# Patient Record
Sex: Male | Born: 1955
Health system: Southern US, Community
[De-identification: ages and names within clinical notes are randomized; demographics above are authoritative.]

## PROBLEM LIST (undated history)

## (undated) DIAGNOSIS — I471 Supraventricular tachycardia, unspecified: Secondary | ICD-10-CM

## (undated) DIAGNOSIS — N200 Calculus of kidney: Secondary | ICD-10-CM

## (undated) DIAGNOSIS — F419 Anxiety disorder, unspecified: Secondary | ICD-10-CM

## (undated) DIAGNOSIS — R7303 Prediabetes: Secondary | ICD-10-CM

## (undated) DIAGNOSIS — E559 Vitamin D deficiency, unspecified: Secondary | ICD-10-CM

## (undated) DIAGNOSIS — H9319 Tinnitus, unspecified ear: Secondary | ICD-10-CM

## (undated) DIAGNOSIS — K8020 Calculus of gallbladder without cholecystitis without obstruction: Secondary | ICD-10-CM

## (undated) DIAGNOSIS — M5430 Sciatica, unspecified side: Secondary | ICD-10-CM

## (undated) DIAGNOSIS — I10 Essential (primary) hypertension: Secondary | ICD-10-CM

## (undated) DIAGNOSIS — R739 Hyperglycemia, unspecified: Secondary | ICD-10-CM

## (undated) DIAGNOSIS — E7800 Pure hypercholesterolemia, unspecified: Secondary | ICD-10-CM

## (undated) DIAGNOSIS — J302 Other seasonal allergic rhinitis: Secondary | ICD-10-CM

## (undated) DIAGNOSIS — Z6830 Body mass index (BMI) 30.0-30.9, adult: Secondary | ICD-10-CM

## (undated) DIAGNOSIS — N400 Enlarged prostate without lower urinary tract symptoms: Secondary | ICD-10-CM

## (undated) DIAGNOSIS — K649 Unspecified hemorrhoids: Secondary | ICD-10-CM

## (undated) DIAGNOSIS — Z87442 Personal history of urinary calculi: Secondary | ICD-10-CM

## (undated) DIAGNOSIS — M549 Dorsalgia, unspecified: Secondary | ICD-10-CM

## (undated) DIAGNOSIS — G4730 Sleep apnea, unspecified: Secondary | ICD-10-CM

## (undated) DIAGNOSIS — E781 Pure hyperglyceridemia: Secondary | ICD-10-CM

## (undated) HISTORY — DX: Tinnitus, unspecified ear: H93.19

## (undated) HISTORY — DX: Supraventricular tachycardia: I47.1

## (undated) HISTORY — DX: Pure hypercholesterolemia, unspecified: E78.00

## (undated) HISTORY — DX: Supraventricular tachycardia, unspecified: I47.10

## (undated) HISTORY — DX: Sleep apnea, unspecified: G47.30

## (undated) HISTORY — DX: Anxiety disorder, unspecified: F41.9

## (undated) HISTORY — DX: Other seasonal allergic rhinitis: J30.2

## (undated) HISTORY — DX: Benign prostatic hyperplasia without lower urinary tract symptoms: N40.0

## (undated) HISTORY — DX: Prediabetes: R73.03

## (undated) HISTORY — DX: Hyperglycemia, unspecified: R73.9

## (undated) HISTORY — DX: Vitamin D deficiency, unspecified: E55.9

## (undated) HISTORY — DX: Essential (primary) hypertension: I10

## (undated) HISTORY — DX: Dorsalgia, unspecified: M54.9

## (undated) HISTORY — DX: Sciatica, unspecified side: M54.30

## (undated) HISTORY — DX: Body mass index (BMI) 30.0-30.9, adult: Z68.30

## (undated) HISTORY — DX: Calculus of gallbladder without cholecystitis without obstruction: K80.20

## (undated) HISTORY — DX: Unspecified hemorrhoids: K64.9

## (undated) HISTORY — DX: Pure hyperglyceridemia: E78.1

## (undated) HISTORY — PX: DE QUERVAIN'S RELEASE: SHX1439

## (undated) HISTORY — DX: Calculus of kidney: N20.0

---

## 2014-01-15 HISTORY — PX: HEMORRHOID BANDING: SHX5850

## 2014-10-29 ENCOUNTER — Other Ambulatory Visit: Payer: Self-pay | Admitting: *Deleted

## 2014-10-29 ENCOUNTER — Ambulatory Visit (HOSPITAL_COMMUNITY)
Admission: RE | Admit: 2014-10-29 | Discharge: 2014-10-29 | Disposition: A | Discharge status: Home / Self Care | Payer: BLUE CROSS/BLUE SHIELD | Source: Ambulatory Visit | Attending: Vascular Surgery | Admitting: Vascular Surgery

## 2014-10-29 DIAGNOSIS — M79606 Pain in leg, unspecified: Secondary | ICD-10-CM | POA: Diagnosis not present

## 2014-10-29 DIAGNOSIS — I8391 Asymptomatic varicose veins of right lower extremity: Secondary | ICD-10-CM | POA: Diagnosis not present

## 2014-10-29 DIAGNOSIS — R600 Localized edema: Secondary | ICD-10-CM | POA: Diagnosis not present

## 2014-10-29 DIAGNOSIS — M79661 Pain in right lower leg: Secondary | ICD-10-CM | POA: Insufficient documentation

## 2015-03-10 MED FILL — ESCITALOPRAM 10 MG TABLET: 10 | 90 days supply | Qty: 90 | Fill #3

## 2015-03-10 MED FILL — PIOGLITAZONE HCL 30 MG TAB: 30 | 90 days supply | Qty: 90 | Fill #1

## 2015-03-10 MED FILL — FENOFIBRATE 145 MG TABLET: 145 | 90 days supply | Qty: 90 | Fill #0

## 2015-06-01 MED FILL — PIOGLITAZONE HCL 30 MG TAB: 30 | 90 days supply | Qty: 90 | Fill #0

## 2015-06-01 MED FILL — FENOFIBRATE 145 MG TABLET: 145 | 90 days supply | Qty: 90 | Fill #0

## 2015-06-01 MED FILL — ESCITALOPRAM 10 MG TABLET: 10 | 90 days supply | Qty: 90 | Fill #0

## 2015-09-12 MED FILL — PIOGLITAZONE HCL 30 MG TAB: 30 | 90 days supply | Qty: 90 | Fill #1

## 2015-09-12 MED FILL — FENOFIBRATE 145 MG TABLET: 145 | 90 days supply | Qty: 90 | Fill #1

## 2015-09-12 MED FILL — ESCITALOPRAM 10 MG TABLET: 10 | 90 days supply | Qty: 90 | Fill #1

## 2015-12-16 MED FILL — ESCITALOPRAM 10 MG TABLET: 10 | 90 days supply | Qty: 90 | Fill #2 | Status: TO

## 2015-12-16 MED FILL — FENOFIBRATE 145 MG TABLET: 145 | 90 days supply | Qty: 90 | Fill #2 | Status: TO

## 2015-12-16 MED FILL — PIOGLITAZONE HCL 30 MG TAB: 30 | 90 days supply | Qty: 90 | Fill #2 | Status: TO

## 2016-04-11 ENCOUNTER — Encounter: Payer: Self-pay | Admitting: Sports Medicine

## 2016-04-11 ENCOUNTER — Ambulatory Visit (INDEPENDENT_AMBULATORY_CARE_PROVIDER_SITE_OTHER): Payer: PRIVATE HEALTH INSURANCE | Admitting: Sports Medicine

## 2016-04-11 ENCOUNTER — Ambulatory Visit: Payer: Self-pay

## 2016-04-11 VITALS — BP 140/72 | HR 72 | Ht 74.0 in | Wt 253.2 lb

## 2016-04-11 DIAGNOSIS — M25531 Pain in right wrist: Secondary | ICD-10-CM | POA: Diagnosis not present

## 2016-04-11 DIAGNOSIS — M654 Radial styloid tenosynovitis [de Quervain]: Secondary | ICD-10-CM | POA: Insufficient documentation

## 2016-04-11 NOTE — Assessment & Plan Note (Signed)
Ultrasound-guided injection today.  He responded well to the last.  If persistent ongoing symptoms consider further evaluation and consideration of first dorsal compartment release.

## 2016-04-11 NOTE — Progress Notes (Signed)
Henry Fleming - 61 y.o. male MRN [id number]  Date of birth: 04-Sep-1955  Office Visit Note: Visit Date: 04/11/2016 PCP: Maryelizabeth Rowan, MD Referred by: Lewis Moccasin, MD  Subjective: Chief Complaint  Patient presents with  . pain in wrist    right   HPI: Patient presents for reevaluation of right dorsal wrist pain.  This is worsened over the past 2 months.  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  Reports he thinks this has caused him to flare back up.  He did well with a prior de Quervain's injection with 100% improvement in his symptoms.  He is now having difficulty fully extending his thumb. ROS:   Otherwise per HPI.  Objective:  VS:  HT:6\' 2"  (188 cm)   WT:253 lb 3.2 oz (114.9 kg)  BMI:32.6    BP:140/72  HR:72bpm  TEMP: ( )  RESP:96 % Physical Exam: General:   WDWN, NAD, Non-toxic appearing Psych:  Alert & appropriately interactive  Not depressed or anxious appearing Upper Extremities:  No significant rashes  No significant generalized UE edema.  No clubbing or cyanosis.  Radial pulses 2+/4.  Sensation intact to light touch. Right Wrist/Hand:  Overall wrist  is well aligned, no significant deformity or atrophy.    No significant effusion or swelling.    Grip strength intact.  Wrist extension is normal.  He does have limitation in thumb extension by approximately 10 on the right compared to the left.  No significant TTP over the anatomic snuff box, distal radius & ulna, or proximal & distal carpal rows  Pain with Lourena Simmonds testing    Imaging & Procedures: No results found. PROCEDURE NOTE: ULTRASOUND GUIDED RIGHT 1st DORSAL COMPARTMENTINJECTION  Images were obtained and interpreted by myself, Gaspar Bidding, DO  Images have been saved and stored to PACS system. Images obtained on: GE S7 Ultrasound machine  DESCRIPTION OF PROCEDURE:  The patient's clinical condition is marked by substantial pain  and/or significant functional disability. Other conservative therapy has not provided relief, is contraindicated, or not appropriate. There is a reasonable likelihood that injection will significantly improve the patient's pain and/or functional impairment. After discussing the risks, benefits and expected outcomes of the injection and all questions were reviewed and answered, the patient wished to undergo the above named procedure. Verbal consent was obtained. The ultrasound was used to identify the target structure and adjacent neurovascular structures. The skin was then prepped in sterile fashion and the target structure was injected under direct visualization using sterile technique as below: PREP: Alcohol, Ethel Chloride APPROACH: 25g 1.5" needle INJECTATE: 0.5cc 1% lidocaine, 0.5cc 0.5% marcaine, 0.5cc 40mg  DepoMedrol ASPIRATE: N/A DRESSING: Band-Aid   Post procedural instructions including recommending icing and warning signs for infection were reviewed. This procedure was well tolerated and there were no complications.   IMPRESSION: Succesful US Guided Injection    Assessment & Plan: Problem List Items Addressed This Visit    De Quervain's tenosynovitis - Primary    Ultrasound-guided injection today.  He responded well to the last.  If persistent ongoing symptoms consider further evaluation and consideration of first dorsal compartment release.      Relevant Orders   US Guided Needle Placement    Other Visit Diagnoses    Right wrist pain          Follow-up: Return if symptoms worsen or fail to improve.   Past Medical/Family/Surgical/Social History: Medications & Allergies reviewed per EMR Patient Active Problem List  Diagnosis Date Noted  . De Quervain's tenosynovitis 04/11/2016   No past medical history on file. No family history on file. No past surgical history on file. Social History   Occupational History  . Not on file.   Social History Main Topics  .  Smoking status: Never Smoker  . Smokeless tobacco: Never Used  . Alcohol use Not on file  . Drug use: Unknown  . Sexual activity: Not on file

## 2018-10-24 ENCOUNTER — Other Ambulatory Visit: Payer: Self-pay

## 2018-10-24 DIAGNOSIS — Z20822 Contact with and (suspected) exposure to covid-19: Secondary | ICD-10-CM

## 2018-10-25 LAB — NOVEL CORONAVIRUS, NAA: SARS-CoV-2, NAA: NOT DETECTED

## 2020-01-25 ENCOUNTER — Other Ambulatory Visit: Payer: Self-pay

## 2020-01-25 ENCOUNTER — Encounter: Payer: Self-pay | Admitting: Internal Medicine

## 2020-01-25 ENCOUNTER — Ambulatory Visit (INDEPENDENT_AMBULATORY_CARE_PROVIDER_SITE_OTHER): Payer: Medicare Other | Admitting: Internal Medicine

## 2020-01-25 VITALS — BP 120/70 | HR 66 | Ht 75.0 in | Wt 250.0 lb

## 2020-01-25 DIAGNOSIS — I471 Supraventricular tachycardia: Secondary | ICD-10-CM

## 2020-01-25 DIAGNOSIS — E781 Pure hyperglyceridemia: Secondary | ICD-10-CM | POA: Diagnosis not present

## 2020-01-25 DIAGNOSIS — R0683 Snoring: Secondary | ICD-10-CM

## 2020-01-25 MED ORDER — DILTIAZEM HCL 30 MG PO TABS: 30.0000 mg | ORAL_TABLET | ORAL | 3 refills | Status: AC | PRN

## 2020-01-25 NOTE — Patient Instructions (Addendum)
Medication Instructions:  Start Diltiazem 30 mg to 60 mg every 6 hours as needed for fast heart rate. Labwork: None ordered.  Testing/Procedures: please schedule echo Your physician has recommended that you have a sleep study. This test records several body functions during sleep, including: brain activity, eye movement, oxygen and carbon dioxide blood levels, heart rate and rhythm, breathing rate and rhythm, the flow of air through your mouth and nose, snoring, body muscle movements, and chest and belly movement.  Your physician has requested that you have an echocardiogram. Echocardiography is a painless test that uses sound waves to create images of your heart. It provides your doctor with information about the size and shape of your heart and how well your heart's chambers and valves are working. This procedure takes approximately one hour. There are no restrictions for this procedure.   Follow-Up: Your physician wants you to follow-up in: as needed with Hillis Range, MD or one of the following Advanced Practice Providers on your designated Care Team:    Gypsy Balsam, NP  Francis Dowse, PA-C  Casimiro Needle "Mardelle Matte" Lanna Poche, [place]   Any Other Special Instructions Will Be Listed Below (If Applicable).  If you need a refill on your cardiac medications before your next appointment, please call your pharmacy.

## 2020-01-25 NOTE — Progress Notes (Signed)
Electrophysiology Office Note   Date:  01/25/2020   ID:  Henry Fleming, DOB 1956-01-03, MRN [id number]  PCP:  Lewis Moccasin, MD  Primary Electrophysiologist: Hillis Range, MD    CC: SVT   History of Present Illness: Henry Fleming is a 65 y.o. male who presents today for electrophysiology evaluation.   He is referred by Dr Duanne Guess for EP consultation regarding SVT. The patient reports abrupt onset, offset tachypalpitations 1-2 times per year.  Symptoms are primarily palpitations.  If he exerts during tachycardia, he has SOB and dizziness.  He is unaware of triggers/ precipitants.  He has used Kardiamobile to document regular SVT at 150-180 bpm.  Today, he denies symptoms of palpitations, chest pain, shortness of breath, orthopnea, PND, lower extremity edema, claudication, dizziness, presyncope, syncope, bleeding, or neurologic sequela. The patient is tolerating medications without difficulties and is otherwise without complaint today.    Past Medical History:  Diagnosis Date  . Anxiety   . Body mass index (BMI) 30.0-30.9, adult   . BPH (benign prostatic hyperplasia)   . Gallstone    asymptomatic  . Hemorrhoid    s/p banding  . HTN (hypertension)   . Hypertriglyceridemia   . Nephrolithiasis   . Paroxysmal SVT (supraventricular tachycardia) (HCC)   . Prediabetes    impaired fasting glucose  . Sciatica    resolved  . Seasonal allergies   . Tinnitus   . Vitamin D deficiency    Past Surgical History:  Procedure Laterality Date  . DE QUERVAIN'S RELEASE       Current Outpatient Medications  Medication Sig Dispense Refill  . Alpha-Lipoic Acid 600 MG TABS Take 1 tablet by mouth daily.    [name] aspirin 81 MG chewable tablet Chew 81 mg by mouth daily.    . candesartan-hydrochlorothiazide (ATACAND HCT) 32-12.5 MG tablet Take 1 tablet by mouth daily.    . Cholecalciferol 1.25 MG (50000 UT) capsule Take 1 capsule by mouth once a week.    . Coenzyme Q10 100 MG capsule  Take 1 capsule by mouth daily.    [name] diltiazem (CARDIZEM) 30 MG tablet Take 1 tablet (30 mg total) by mouth as needed (1-2 tablets every 6 hours for fast heart rate.). 60 tablet 3  . escitalopram (LEXAPRO) 10 MG tablet Take 1 tablet by mouth daily.    [name] icosapent Ethyl (VASCEPA) 1 g capsule Take 2 capsules by mouth in the morning and at bedtime.    . Lutein-Zeaxanthin 20-1 MG CAPS Take 1 capsule by mouth daily.    . Multiple Vitamin (MULTIVITAMIN ADULT) TABS Take 1 tablet by mouth daily.    . pioglitazone (ACTOS) 15 MG tablet Take 15 mg by mouth daily.    . rosuvastatin (CRESTOR) 20 MG tablet Take 1 tablet by mouth daily.     No current facility-administered medications for this visit.    Allergies:   Ace inhibitors and Ramipril   Social History:  The patient  reports that he has never smoked. He has never used smokeless tobacco.   Family History:  The patient's family history includes Alzheimer's disease in his mother; Atrial fibrillation in his brother and sister; Diabetes in his mother and son; Heart Problems in his brother, father, mother, and sister; High Cholesterol in his mother and son; High blood pressure in his brother, father, and sister; Thyroid cancer in his sister; Thyroid disease in his father and son.    ROS:  Please see the history of present illness.  All other systems are personally reviewed and negative.    PHYSICAL EXAM: VS:  BP 120/70   Pulse 66   Ht 6\' 3"  (1.905 m)   Wt 250 lb (113.4 kg)   SpO2 96%   BMI 31.25 kg/m  , BMI Body mass index is 31.25 kg/m. GEN: Well nourished, well developed, in no acute distress HEENT: normal Neck: no JVD, carotid bruits, or masses Cardiac: RRR; no murmurs, rubs, or gallops,no edema  Respiratory:  clear to auscultation bilaterally, normal work of breathing GI: soft, nontender, nondistended, + BS MS: no deformity or atrophy Skin: warm and dry  Neuro:  Strength and sensation are intact Psych: euthymic mood, full  affect  EKG:  EKG is ordered today. The ekg ordered today is personally reviewed and shows sinus rhythm 68 bpm PR 184 msec,QRS 98 msec, Qtc 433 msec   Recent Labs: No results found for requested labs within last 8760 hours.  personally reviewed   Lipid Panel  No results found for: CHOL, TRIG, HDL, CHOLHDL, VLDL, LDLCALC, LDLDIRECT personally reviewed   Wt Readings from Last 3 Encounters:  01/25/20 250 lb (113.4 kg)  04/11/16 253 lb 3.2 oz (114.9 kg)      Other studies personally reviewed: Additional studies/ records that were reviewed today include: prior records from PCP, Kardia mobile strips,   Review of the above records today demonstrates: as above   ASSESSMENT AND PLAN:  1.  SVT He has documented narrow complex regular SVT by KardiaMobile.  Episodes are infrequent and well tolerated. Ddx includes atrial flutter, AVRT, AVNRT, and atach. We discussed EPS and RFA as an option.  Given infrequent nature, he would prefer a more conservative approach.  I would not advise daily medicine at this time. I have given prn diltiazem as an option today. We will obtain an echo to exclude structural heart disease.  2. HL Regular exercise encouraged  3. Snoring He snores and does not feel well rested in the am.  We will order a home sleep study.  Return as needed   Current medicines are reviewed at length with the patient today.   The patient does not have concerns regarding his medicines.  The following changes were made today:  none  Labs/ tests ordered today include:  Orders Placed This Encounter  Procedures  . EKG 12-Lead  . ECHOCARDIOGRAM COMPLETE  . Home sleep test     Signed, 04/13/16, MD  01/25/2020 8:52 PM     [facility] [address] [place] [phone] (office) [phone] (fax)

## 2020-01-26 ENCOUNTER — Other Ambulatory Visit: Payer: Self-pay | Admitting: *Deleted

## 2020-02-15 ENCOUNTER — Ambulatory Visit (HOSPITAL_COMMUNITY): Payer: Medicare Other | Attending: Cardiovascular Disease

## 2020-02-15 ENCOUNTER — Other Ambulatory Visit: Payer: Self-pay

## 2020-02-15 DIAGNOSIS — I471 Supraventricular tachycardia: Secondary | ICD-10-CM | POA: Diagnosis present

## 2020-02-15 LAB — ECHOCARDIOGRAM COMPLETE
Area-P 1/2: 3.99 cm2
S' Lateral: 2.8 cm

## 2020-03-08 ENCOUNTER — Telehealth: Payer: Self-pay | Admitting: *Deleted

## 2020-03-08 NOTE — Telephone Encounter (Signed)
-----   Message from Reesa Chew, CMA sent at 03/08/2020  5:59 PM EST ----- Regarding: FW: precert  ----- Message ----- From: Reesa Chew, CMA Sent: 02/24/2020   5:40 PM EST To: Cv Div Sleep Studies Subject: precert                                        HST ----- Message ----- From: Sampson Goon, RN Sent: 02/16/2020   2:55 PM EST To: Reesa Chew, CMA Subject: RE: home sleep study                           Snoring and fatigue :) ----- Message ----- From: Reesa Chew, CMA Sent: 02/16/2020   2:18 PM EST To: Sampson Goon, RN Subject: RE: home sleep study                           Need DX codes.please ----- Message ----- From: Sampson Goon, RN Sent: 02/16/2020   1:34 PM EST To: Reesa Chew, CMA Subject: RE: home sleep study                           Hey, I should have but its possible I could have thought I did and missed it by mistake. Is there anything else you need from me regarding this to get the process started? Thank you  Inetta Fermo  ----- Message ----- From: Reesa Chew, CMA Sent: 02/16/2020   1:26 PM EST To: Sampson Goon, RN Subject: RE: home sleep study                           Did you send me a staff message on him? I don't have anything on him. Thanks ----- Message ----- From: Sampson Goon, RN Sent: 02/16/2020  11:13 AM EST To: Sampson Goon, RN, Reesa Chew, CMA Subject: home sleep study                               Hey, Just checking the progress for the sleep study.  Thank you, Inetta Fermo

## 2020-03-08 NOTE — Telephone Encounter (Addendum)
Informed patient of upcoming home sleep study and patient understanding was verbalized. Patient is aware and agreeable to Home Sleep Study through [facility]. Patient is scheduled for 04-04-20 at 9:30 to pick up home sleep kit and meet with Respiratory therapist at [facility]. Patient is aware that if this appointment date and time does not work for them they should contact Artis Delay directly at [phone]. Patient is aware that a sleep packet will be sent from [facility] in week. Patient is agreeable to treatment and thankful for call.

## 2020-03-25 ENCOUNTER — Encounter (HOSPITAL_BASED_OUTPATIENT_CLINIC_OR_DEPARTMENT_OTHER): Payer: Medicare Other | Admitting: Cardiology

## 2020-04-04 ENCOUNTER — Ambulatory Visit (HOSPITAL_BASED_OUTPATIENT_CLINIC_OR_DEPARTMENT_OTHER): Payer: Medicare Other | Attending: Internal Medicine | Admitting: Cardiology

## 2020-04-04 ENCOUNTER — Other Ambulatory Visit: Payer: Self-pay

## 2020-04-04 ENCOUNTER — Encounter (HOSPITAL_BASED_OUTPATIENT_CLINIC_OR_DEPARTMENT_OTHER): Payer: Medicare Other | Admitting: Cardiology

## 2020-04-04 DIAGNOSIS — G4733 Obstructive sleep apnea (adult) (pediatric): Secondary | ICD-10-CM | POA: Diagnosis not present

## 2020-04-04 DIAGNOSIS — I471 Supraventricular tachycardia, unspecified: Secondary | ICD-10-CM

## 2020-04-06 NOTE — Procedures (Signed)
   Patient Name: Henry Fleming, Henry Fleming Date: 04/04/2020 Gender: Male D.O.B: Jan 18, 1955 Age (years): 1 Referring Provider: Hillis Range Height (inches): 75 Interpreting Physician: Armanda Magic MD, ABSM Weight (lbs): 245 RPSGT: [place] BMI: 31 MRN: [id number] Neck Size: 17.00  CLINICAL INFORMATION Sleep Study Type: HST  Indication for sleep study: N/A  Epworth Sleepiness Score: 5  SLEEP STUDY TECHNIQUE A multi-channel overnight portable sleep study was performed. The channels recorded were: nasal airflow, thoracic respiratory movement, and oxygen saturation with a pulse oximetry. Snoring was also monitored.  MEDICATIONS Patient self administered medications include: N/A.  SLEEP ARCHITECTURE Patient was studied for 546.7 minutes. The sleep efficiency was 100.0 % and the patient was supine for 0%. The arousal index was 0.0 per hour.  RESPIRATORY PARAMETERS The overall AHI was 38.3 per hour, with a central apnea index of 0 per hour.  The oxygen nadir was 87% during sleep.  CARDIAC DATA Mean heart rate during sleep was 59.6 bpm.  IMPRESSIONS - Severe obstructive sleep apnea occurred during this study (AHI = 38.3/h). - Mild oxygen desaturation was noted during this study (Min O2 = 87%). - No snoring was audible during this study.  DIAGNOSIS - Obstructive Sleep Apnea (G47.33)  RECOMMENDATIONS - Recommend CPAP titration in lab due to severity of OSA. - Avoid alcohol, sedatives and other CNS depressants that may worsen sleep apnea and disrupt normal sleep architecture. - Sleep hygiene should be reviewed to assess factors that may improve sleep quality. - Weight management and regular exercise should be initiated or continued.  [Electronically signed] 04/06/2020 12:03 PM  Armanda Magic MD, ABSM Diplomate, American Board of Sleep Medicine

## 2020-04-07 ENCOUNTER — Telehealth: Payer: Self-pay | Admitting: *Deleted

## 2020-04-07 DIAGNOSIS — G4733 Obstructive sleep apnea (adult) (pediatric): Secondary | ICD-10-CM

## 2020-04-07 NOTE — Telephone Encounter (Signed)
Informed patient of sleep study results and patient understanding was verbalized. Patient understands his sleep study showed they have sleep apnea and recommend CPAP titration. Please set up titration in the sleep lab.   Patient has questions for [facility] and dr Mayford Knife. Titration pending after he speaks to them.  cpap titration to be scheduled

## 2020-04-07 NOTE — Telephone Encounter (Signed)
-----   Message from Quintella Reichert, MD sent at 04/06/2020 12:06 PM EDT ----- Please let patient know that they have sleep apnea and recommend CPAP titration. Please set up titration in the sleep lab.

## 2020-04-16 ENCOUNTER — Ambulatory Visit (HOSPITAL_BASED_OUTPATIENT_CLINIC_OR_DEPARTMENT_OTHER): Payer: Medicare Other | Attending: Cardiology | Admitting: Cardiology

## 2020-04-16 ENCOUNTER — Other Ambulatory Visit: Payer: Self-pay

## 2020-04-16 DIAGNOSIS — Z9989 Dependence on other enabling machines and devices: Secondary | ICD-10-CM | POA: Insufficient documentation

## 2020-04-16 DIAGNOSIS — G4733 Obstructive sleep apnea (adult) (pediatric): Secondary | ICD-10-CM | POA: Insufficient documentation

## 2020-04-19 NOTE — Procedures (Signed)
   Patient Name: Henry Fleming, [place] Date: 04/16/2020 Gender: Male D.O.B: September 18, 1955 Age (years): 1 Referring Provider: Hillis Range Height (inches): 75 Interpreting Physician: Armanda Magic MD, ABSM Weight (lbs): 245 RPSGT: Rolene Arbour BMI: 31 MRN: [id number] Neck Size: 17.00  CLINICAL INFORMATION The patient is referred for a CPAP titration to treat sleep apnea.  SLEEP STUDY TECHNIQUE As per the AASM Manual for the Scoring of Sleep and Associated Events v2.3 (April 2016) with a hypopnea requiring 4% desaturations.  The channels recorded and monitored were frontal, central and occipital EEG, electrooculogram (EOG), submentalis EMG (chin), nasal and oral airflow, thoracic and abdominal wall motion, anterior tibialis EMG, snore microphone, electrocardiogram, and pulse oximetry. Continuous positive airway pressure (CPAP) was initiated at the beginning of the study and titrated to treat sleep-disordered breathing.  MEDICATIONS Medications self-administered by patient taken the night of the study : N/A  TECHNICIAN COMMENTS Comments added by technician: Patient was restless all through the night. Patient had difficulty initiating sleep. Comments added by scorer: N/A  RESPIRATORY PARAMETERS Optimal PAP Pressure (cm): 12  AHI at Optimal Pressure (/hr):0 Overall Minimal O2 (%):90.0  Supine % at Optimal Pressure (%):100 Minimal O2 at Optimal Pressure (%): 92.0   SLEEP ARCHITECTURE The study was initiated at 10:55:26 PM and ended at 6:20:15 AM.  Sleep onset time was 28.4 minutes and the sleep efficiency was 64.0%. The total sleep time was 284.9 minutes.  The patient spent 2.8% of the night in stage N1 sleep, 68.2% in stage N2 sleep, 0.0% in stage N3 and 29% in REM.Stage REM latency was 333.0 minutes  Wake after sleep onset was 131.5. Alpha intrusion was absent. Supine sleep was 48.45%.  CARDIAC DATA The 2 lead EKG demonstrated sinus rhythm. The mean heart rate was 59.9  beats per minute. Other EKG findings include: None.  LEG MOVEMENT DATA The total Periodic Limb Movements of Sleep (PLMS) were 0. The PLMS index was 0.0. A PLMS index of <15 is considered normal in adults.  IMPRESSIONS - The optimal PAP pressure was 12 cm of water. - Significant oxygen desaturations were not observed during this titration (min O2 = 90.0%). - The patient snored with soft snoring volume during this titration study. - No cardiac abnormalities were observed during this study. - Clinically significant periodic limb movements were not noted during this study. Arousals associated with PLMs were rare.  DIAGNOSIS - Obstructive Sleep Apnea (G47.33)  RECOMMENDATIONS - Trial of CPAP therapy on 12 cm H2O with a Medium size Fisher&Paykel Full Face Mask Simplus mask and heated humidification. - Avoid alcohol, sedatives and other CNS depressants that may worsen sleep apnea and disrupt normal sleep architecture. - Sleep hygiene should be reviewed to assess factors that may improve sleep quality. - Weight management and regular exercise should be initiated or continued. - Return to Sleep Center for re-evaluation after 6 weeks of therapy  [Electronically signed] 04/19/2020 09:09 AM  Armanda Magic MD, ABSM Diplomate, American Board of Sleep Medicine

## 2020-04-22 NOTE — Telephone Encounter (Signed)
Pt is calling back in regard to the message he sent earlier today. Please advise

## 2020-04-26 NOTE — Telephone Encounter (Signed)
Informed patient of titration results and verbalized understanding was indicated. Patient understands showed they had a successful PAP titration and let DME know that orders are in EPIC. Please set up 6 week OV with me.    Upon patient request DME selection is [facility]. Patient understands she/he will be contacted by [facility] to set up her/he cpap. Patient understands to call if [facility] does not contact her/he with new setup in a timely manner. Patient understands they will be called once confirmation has been received from [facility] that they have received their new machine to schedule 10 week follow up appointment.   [facility] notified of new cpap order  Please add to airview Patient was grateful for the call and thanked me.

## 2020-04-26 NOTE — Telephone Encounter (Signed)
Turner, Traci R, MD  Henry Fleming, CMA Please let patient know that they had a successful PAP titration and let DME know that orders are in EPIC.  Please set up 6 week OV with me.   

## 2020-06-01 ENCOUNTER — Other Ambulatory Visit (HOSPITAL_COMMUNITY): Payer: Self-pay | Admitting: *Deleted

## 2020-06-06 ENCOUNTER — Ambulatory Visit (HOSPITAL_BASED_OUTPATIENT_CLINIC_OR_DEPARTMENT_OTHER)
Admission: RE | Admit: 2020-06-06 | Discharge: 2020-06-06 | Disposition: A | Discharge status: Home / Self Care | Payer: Medicare Other | Source: Ambulatory Visit | Attending: Cardiology | Admitting: Cardiology

## 2020-06-06 ENCOUNTER — Other Ambulatory Visit: Payer: Self-pay

## 2020-06-08 ENCOUNTER — Encounter: Payer: Self-pay | Admitting: Cardiology

## 2020-06-08 ENCOUNTER — Ambulatory Visit (INDEPENDENT_AMBULATORY_CARE_PROVIDER_SITE_OTHER): Payer: Medicare Other | Admitting: Cardiology

## 2020-06-08 ENCOUNTER — Telehealth: Payer: Self-pay | Admitting: *Deleted

## 2020-06-08 ENCOUNTER — Other Ambulatory Visit: Payer: Self-pay

## 2020-06-08 VITALS — BP 148/64 | HR 84 | Ht 75.0 in | Wt 255.2 lb

## 2020-06-08 DIAGNOSIS — G4733 Obstructive sleep apnea (adult) (pediatric): Secondary | ICD-10-CM

## 2020-06-08 DIAGNOSIS — I471 Supraventricular tachycardia: Secondary | ICD-10-CM | POA: Diagnosis not present

## 2020-06-08 DIAGNOSIS — I10 Essential (primary) hypertension: Secondary | ICD-10-CM

## 2020-06-08 MED ORDER — LORATADINE 10 MG PO TABS: 10.0000 mg | ORAL_TABLET | Freq: Every day | ORAL | Status: DC | PRN

## 2020-06-08 MED ORDER — SALINE SPRAY 0.65 % NA SOLN: 2.0000 | Freq: Two times a day (BID) | NASAL | 0 refills | Status: DC

## 2020-06-08 NOTE — Addendum Note (Signed)
Addended by: Theresia Majors on: 06/08/2020 04:34 PM   Modules accepted: Orders

## 2020-06-08 NOTE — Patient Instructions (Signed)
Medication Instructions:  Your physician has recommended you make the following change in your medication:  1) START using nasal saline spray two sprays in both nostrils twice daily 2) START taking Claritin as needed *If you need a refill on your cardiac medications before your next appointment, please call your pharmacy*  Follow-Up: At [facility], you and your health needs are our priority.  As part of our continuing mission to provide you with exceptional heart care, we have created designated Provider Care Teams.  These Care Teams include your primary Cardiologist (physician) and Advanced Practice Providers (APPs -  Physician Assistants and Nurse Practitioners) who all work together to provide you with the care you need, when you need it.  Your next appointment:   1 year(s)  The format for your next appointment:   In Person  Provider:   Armanda Magic, MD

## 2020-06-08 NOTE — Telephone Encounter (Signed)
-----   Message from [place], [place] sent at 06/08/2020  4:34 PM EDT ----- Please change him to auto CPAP from 4 to 15cm H2O and order CPAP supplies for a year.  Thanks!

## 2020-06-08 NOTE — Progress Notes (Signed)
Cardiology Office Note:    Date:  06/08/2020   ID:  Henry Fleming, DOB Nov 26, 1955, MRN [id number]  PCP:  Lewis Moccasin, MD  Cardiologist:  None    Referring MD: Lewis Moccasin, MD   Chief Complaint  Patient presents with  . Follow-up    OSA, HTN     History of Present Illness:    Henry Fleming is a 65 y.o. male with a hx of HTN, HLD and SVT.  He was referred for sleep study by Dr. Johney Frame due to SVT and complains of non restorative sleep and snoring.  He underwent home sleep study and showed severe OSA with an AHI of 38/hr and O2 sats as low as 87%.  He underwent CPAP titration successfully to 12cm H2O and his now here for followup.   He is doing well with his CPAP device and thinks that he has gotten used to it.  He tolerates the mask and feels the pressure is adequate.  Since going on CPAP he feels rested in the am and has no significant daytime sleepiness.  He denies any significant mouth or nasal dryness or nasal congestion.  He does not think that he snores.     Past Medical History:  Diagnosis Date  . Anxiety   . Body mass index (BMI) 30.0-30.9, adult   . BPH (benign prostatic hyperplasia)   . Gallstone    asymptomatic  . Hemorrhoid    s/p banding  . HTN (hypertension)   . Hypertriglyceridemia   . Nephrolithiasis   . Paroxysmal SVT (supraventricular tachycardia) (HCC)   . Prediabetes    impaired fasting glucose  . Sciatica    resolved  . Seasonal allergies   . Tinnitus   . Vitamin D deficiency     Past Surgical History:  Procedure Laterality Date  . DE QUERVAIN'S RELEASE      Current Medications: No outpatient medications have been marked as taking for the 06/08/20 encounter (Office Visit) with Quintella Reichert, MD.     Allergies:   Ace inhibitors and Ramipril   Social History   Socioeconomic History  . Marital status: Married    Spouse name: Not on file  . Number of children: Not on file  . Years of education: Not on file  .  Highest education level: Not on file  Occupational History  . Not on file  Tobacco Use  . Smoking status: Never Smoker  . Smokeless tobacco: Never Used  Substance and Sexual Activity  . Alcohol use: Not on file    Comment: rare  . Drug use: Not on file  . Sexual activity: Not on file  Other Topics Concern  . Not on file  Social History Narrative   Lives in [place] with spouse.  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   Social Determinants of Health   Financial Resource Strain: Not on file  Food Insecurity: Not on file  Transportation Needs: Not on file  Physical Activity: Not on file  Stress: Not on file  Social Connections: Not on file     Family History: The patient's family history includes Alzheimer's disease in his mother; Atrial fibrillation in his brother and sister; Diabetes in his mother and son; Heart Problems in his brother, father, mother, and sister; High Cholesterol in his mother and son; High blood pressure in his brother, father, and sister; Thyroid cancer in his sister; Thyroid disease in his father and son.  ROS:   Please see the  history of present illness.    ROS  All other systems reviewed and negative.   EKGs/Labs/Other Studies Reviewed:    The following studies were reviewed today: Sleep study, PAP titration and PAP download  EKG:  EKG is not ordered today.    Recent Labs: No results found for requested labs within last 8760 hours.   Recent Lipid Panel No results found for: CHOL, TRIG, HDL, CHOLHDL, VLDL, LDLCALC, LDLDIRECT  CHA2DS2-VASc Score =   [ ] .  Therefore, the patient's annual risk of stroke is   %.        Physical Exam:    VS:  There were no vitals taken for this visit.    Wt Readings from Last 3 Encounters:  04/17/20 250 lb (113.4 kg)  04/04/20 245 lb (111.1 kg)  01/25/20 250 lb (113.4 kg)     GEN:  Well nourished, well developed in no acute distress HEENT: Normal NECK: No JVD; No carotid bruits LYMPHATICS: No  lymphadenopathy CARDIAC: RRR, no murmurs, rubs, gallops RESPIRATORY:  Clear to auscultation without rales, wheezing or rhonchi  ABDOMEN: Soft, non-tender, non-distended MUSCULOSKELETAL:  No edema; No deformity  SKIN: Warm and dry NEUROLOGIC:  Alert and oriented x 3 PSYCHIATRIC:  Normal affect   ASSESSMENT:    1. OSA (obstructive sleep apnea)   2. Primary hypertension   3. SVT (supraventricular tachycardia) (HCC)    PLAN:    In order of problems listed above:  1.  OSA - The patient is tolerating PAP therapy well without any problems. The PAP download performed by his DME was personally reviewed and interpreted by me today and showed an AHI of 3.7/hr on 12 cm H2O with 100% compliance in using more than 4 hours nightly.  The patient has been using and benefiting from PAP use and will continue to benefit from therapy.  -he complains of some ear pressure and fullness since starting the PAP -I have encouraged him to use nasal saline spray and antihistamines PRN -I will change him to auto CPAP from 4 to 15cm H2O -He will let me know in 2 weeks how he is doing  2.  HTN -BP controlled on exam today -Continue prescription drug management with Candasartan-HCT 32-12.5mg  daily -I have personally reviewed and interpreted outside labs performed by patient's PCP which showed SCr of 1.36, K+ 4.4 and Na 138  3.  SVT -followed by EP -he denies any further episodes of palpitations since starting CPAP -Continue prescription drug management with PRN Cardizem for palpitations  Followup with me in 1 year   Medication Adjustments/Labs and Tests Ordered: Current medicines are reviewed at length with the patient today.  Concerns regarding medicines are outlined above.  No orders of the defined types were placed in this encounter.  No orders of the defined types were placed in this encounter.   Signed, 03/24/20, MD  06/08/2020 3:26 PM    [facility]

## 2020-06-08 NOTE — Telephone Encounter (Signed)
[facility] to [facility] to order auto CPAP from 4 to 15cm H2O and order CPAP supplies for a year.

## 2020-12-14 ENCOUNTER — Telehealth: Payer: Self-pay | Admitting: Orthopaedic Surgery

## 2020-12-14 NOTE — Telephone Encounter (Signed)
Received call from patient. He's inquiring if we have records from approx 20 yrs ago when Dr. Magnus Ivan treated his finger. After checking SRS I advised him we no longer have those records. He voiced understanding. [phone]

## 2021-03-30 ENCOUNTER — Encounter (HOSPITAL_COMMUNITY): Payer: Self-pay | Admitting: Surgery

## 2021-03-30 ENCOUNTER — Other Ambulatory Visit: Payer: Self-pay

## 2021-03-30 NOTE — Progress Notes (Signed)
Surgery orders requested via Epic inbox. °

## 2021-03-30 NOTE — H&P (Signed)
PROVIDER:  Katha Cabal, MD ?  ?MRN: [id number] ?DOB: 1955-03-18 ?  ?Subjective  ?  ?Chief Complaint: Hernia (Inguinal) ?  ?  ?  ?History of Present Illness: ?Henry Fleming is a 66 y.o. male who is seen today as an office consultation at the request of Dr. Seymour Bars for evaluation of Hernia (Inguinal) ?.   ?  ?Henry Fleming, a retired endocrinologist presents with a recent episode that drew a left inguinal hernia to his attention.  He probably has had a left inguinal hernia there for some time but he was walking his dog and with a strong pull he felt a pop and then he was able to perceive a larger hernia defect which was reducible.  He had no complaints on the right side. ?  ?I discussed the repair with mesh both is externally and with a robotic approach using a Tapp procedure.  I think I would recommend a robotic left inguinal hernia repair with 3D max mesh. ?  ?  ?Review of Systems: ?See HPI as well for other ROS. ?  ?Review of Systems  ?HENT: Positive for tinnitus.   ?Cardiovascular: Positive for palpitations.  ?All other systems reviewed and are negative. ?  ?  ?  ?Medical History: ?Past Medical History  ?    ?Past Medical History:  ?Diagnosis Date  ? Arrhythmia    ? Hyperlipidemia    ? Hypertension    ? Sleep apnea    ?  ?  ?  ?   ?Patient Active Problem List  ?Diagnosis  ? Cholelithiasis  ? Urolithiasis  ? De Quervain's tenosynovitis, right  ? Hypertension goal BP (blood pressure) < 130/80  ? Hypertriglyceridemia  ? Impaired fasting glucose  ? Obstructive sleep apnea on CPAP  ? Paroxysmal supraventricular tachycardia (CMS-HCC)  ?  ?  ?Past Surgical History  ?     ?Past Surgical History:  ?Procedure Laterality Date  ? stenosing fenosynovitis N/A    ?  ?  ?  ?Allergies  ?    ?Allergies  ?Allergen Reactions  ? Ace Inhibitors Cough and Other (See Comments)  ?  ?  ?  ?      ?Current Outpatient Medications on File Prior to Visit  ?Medication Sig Dispense Refill  ? candesartan-hydrochlorothiazide (ATACAND HCT)  32-12.5 mg per tablet Take 1 tablet by mouth once daily      ? rosuvastatin (CRESTOR) 20 MG tablet Take 20 mg by mouth once daily      ? alpha lipoic acid 600 mg Cap capsule        ? aspirin 81 mg Cap        ? cholecalciferol (VITAMIN D3) 1,250 mcg (50,000 unit) capsule        ? lutein-zeaxanthin 20 mg- 1,000 mcg Cap        ? multivit-min/ferrous fumarate (MULTI VITAMIN ORAL) Take 1 tablet by mouth once daily      ? VASCEPA 1 gram capsule        ?  ?No current facility-administered medications on file prior to visit.  ?  ?  ?Family History  ?     ?Family History  ?Problem Relation Age of Onset  ? Obesity Mother    ? High blood pressure (Hypertension) Mother    ? Hyperlipidemia (Elevated cholesterol) Mother    ? Diabetes Mother    ? Breast cancer Mother    ? Obesity Father    ? High blood pressure (Hypertension) Father    ?  Diabetes Father    ? Obesity Sister    ? High blood pressure (Hypertension) Sister    ? Hyperlipidemia (Elevated cholesterol) Sister    ? Diabetes Sister    ? Obesity Brother    ? High blood pressure (Hypertension) Brother    ? Hyperlipidemia (Elevated cholesterol) Brother    ? Diabetes Brother    ?  ?  ?  ?Social History  ?  ?   ?Tobacco Use  ?Smoking Status Never  ?Smokeless Tobacco Never  ?  ?  ?Social History  ?Social History  ?  ?    ?Socioeconomic History  ? Marital status: Married  ?Tobacco Use  ? Smoking status: Never  ? Smokeless tobacco: Never  ?Vaping Use  ? Vaping Use: Never used  ?Substance and Sexual Activity  ? Alcohol use: Yes  ? Drug use: Never  ?  ?  ?  ?Objective:  ?  ?  ?   ?Vitals:  ?  03/24/21 1434  ?BP: 108/58  ?Pulse: 83  ?Temp: 36.6 ?C (97.8 ?F)  ?SpO2: 96%  ?Weight: (!) 112.4 kg (247 lb 12.8 oz)  ?Height: 190.5 cm (6\' 3" )  ?  ?Body mass index is 30.97 kg/m?. ?  ?Physical Exam ?General: Tall well-maintained white male no acute distress ?HEENT  : Unremarkable.  Neck without bruits ?Chest: Clear ?Heart: Sinus rhythm with a regular rate.  He has a history of PSVT for which  she carries diltiazem but he does not use it ?Breast: Not examined ?Abdomen: Nontender.  There is a small soft umbilical hernia that is asymptomatic ?GU left inguinal hernia noted that is reducible.  No right inguinal hernia noted ?Rectal not performed ?Extremities full range of motion ?Neuro alert and oriented x3.  Motor and sensory function grossly intact ?  ?  ?  ?  ?Labs, Imaging and Diagnostic Testing: ?None to review ?  ?Assessment and Plan:  ?Diagnoses and all orders for this visit: ?  ?Left inguinal hernia ?  ?  ?  ?Will recommend repair transabdominally with the robot.(TAPP).  Do as an outpatient.  The patient does have obstructive sleep apnea.  We will try to get on schedule soon.  Plan to do this at [facility] under general anesthesia. ?  ?No follow-ups on file. ?  ?Saray Capasso Donia Pounds, MD  ?  ? ?

## 2021-03-30 NOTE — Progress Notes (Addendum)
COVID swab appointment: N/A ? ?COVID Vaccine Completed:  Yes x2 ?Date COVID Vaccine completed: ?Has received booster: Yes x3 ?COVID vaccine manufacturer: [place]  ? ?Date of COVID positive in last 90 days: No ? ?PCP - Rachell Cipro, MD ?Cardiologist - Fransico Him, MD for OSA & Dr. Thompson Grayer for SVT ? ?Chest x-ray - N/A ?EKG - N/A ?Stress Test - N/A ?ECHO - 02-15-20 Epic ?Cardiac Cath - N/A ?Pacemaker/ICD device last checked: ?Spinal Cord Stimulator: ?Cardiac CT - 06-06-20 Epic ? ?Bowel Prep - N/A ? ?Sleep Study - Yes, +sleep apnea ?CPAP - Yes ? ?Prediabetic  ?Fasting Blood Sugar -  ?Checks Blood Sugar - does not check   ? ?Blood Thinner Instructions: ?Aspirin Instructions:  ASA 81 mg.   ?Last Dose:  03-28-21 ? ?Activity level:   Can go up a flight of stairs and perform activities of daily living without stopping and without symptoms of chest pain or shortness of breath. ?  ?Anesthesia review:  SVT, HTN, Severe OSA with CPAP.  Palpitations have improved with the CPAP and he has not had to the use the Diltiazem in a long time. ? ?Patient denies shortness of breath, fever, cough and chest pain at PAT appointment (completed over the phone) ? ?Patient verbalized understanding of instructions that were given to them at the PAT appointment. Patient was also instructed that they will need to review over the PAT instructions again at home before surgery.  ?

## 2021-03-31 ENCOUNTER — Encounter (HOSPITAL_COMMUNITY)
Admission: RE | Disposition: A | Discharge status: Home / Self Care | Payer: Self-pay | Source: Ambulatory Visit | Attending: Surgery

## 2021-03-31 ENCOUNTER — Ambulatory Visit (HOSPITAL_COMMUNITY)
Admission: RE | Admit: 2021-03-31 | Discharge: 2021-03-31 | Disposition: A | Discharge status: Home / Self Care | Payer: Medicare Other | Source: Ambulatory Visit | Attending: Surgery | Admitting: Surgery

## 2021-03-31 ENCOUNTER — Ambulatory Visit (HOSPITAL_BASED_OUTPATIENT_CLINIC_OR_DEPARTMENT_OTHER): Payer: Medicare Other | Admitting: Physician Assistant

## 2021-03-31 ENCOUNTER — Encounter (HOSPITAL_COMMUNITY): Payer: Self-pay | Admitting: Surgery

## 2021-03-31 ENCOUNTER — Other Ambulatory Visit: Payer: Self-pay

## 2021-03-31 ENCOUNTER — Ambulatory Visit (HOSPITAL_COMMUNITY): Payer: Medicare Other | Admitting: Physician Assistant

## 2021-03-31 DIAGNOSIS — G4733 Obstructive sleep apnea (adult) (pediatric): Secondary | ICD-10-CM | POA: Insufficient documentation

## 2021-03-31 DIAGNOSIS — I10 Essential (primary) hypertension: Secondary | ICD-10-CM | POA: Diagnosis not present

## 2021-03-31 DIAGNOSIS — F419 Anxiety disorder, unspecified: Secondary | ICD-10-CM | POA: Insufficient documentation

## 2021-03-31 DIAGNOSIS — K4090 Unilateral inguinal hernia, without obstruction or gangrene, not specified as recurrent: Secondary | ICD-10-CM | POA: Diagnosis present

## 2021-03-31 DIAGNOSIS — Z79899 Other long term (current) drug therapy: Secondary | ICD-10-CM | POA: Insufficient documentation

## 2021-03-31 HISTORY — PX: XI ROBOTIC ASSISTED INGUINAL HERNIA REPAIR WITH MESH: SHX6706

## 2021-03-31 HISTORY — DX: Personal history of urinary calculi: Z87.442

## 2021-03-31 LAB — CBC
HCT: 49.5 % (ref 39.0–52.0)
Hemoglobin: 16.9 g/dL (ref 13.0–17.0)
MCH: 28.9 pg (ref 26.0–34.0)
MCHC: 34.1 g/dL (ref 30.0–36.0)
MCV: 84.8 fL (ref 80.0–100.0)
Platelets: 232 10*3/uL (ref 150–400)
RBC: 5.84 MIL/uL — ABNORMAL HIGH (ref 4.22–5.81)
RDW: 14.8 % (ref 11.5–15.5)
WBC: 6.2 10*3/uL (ref 4.0–10.5)
nRBC: 0 % (ref 0.0–0.2)

## 2021-03-31 LAB — BASIC METABOLIC PANEL
Anion gap: 9 (ref 5–15)
BUN: 22 mg/dL (ref 8–23)
CO2: 22 mmol/L (ref 22–32)
Calcium: 8.9 mg/dL (ref 8.9–10.3)
Chloride: 103 mmol/L (ref 98–111)
Creatinine, Ser: 1.03 mg/dL (ref 0.61–1.24)
GFR, Estimated: >60 mL/min (ref >60)
Glucose, Bld: 115 mg/dL — ABNORMAL HIGH (ref 70–99)
Potassium: 3.5 mmol/L (ref 3.5–5.1)
Sodium: 134 mmol/L — ABNORMAL LOW (ref 135–145)

## 2021-03-31 LAB — HEMOGLOBIN A1C
Hgb A1c MFr Bld: 5.5 % (ref 4.8–5.6)
Mean Plasma Glucose: 111.15 mg/dL

## 2021-03-31 LAB — SAMPLE TO BLOOD BANK

## 2021-03-31 SURGERY — REPAIR, HERNIA, INGUINAL, ROBOT-ASSISTED, LAPAROSCOPIC, USING MESH
Anesthesia: General | Laterality: Left

## 2021-03-31 MED ORDER — ONDANSETRON HCL 4 MG/2ML IJ SOLN: 4.0000 mg | Freq: Once | INTRAMUSCULAR | Status: DC | PRN

## 2021-03-31 MED ORDER — ORAL CARE MOUTH RINSE: 15.0000 mL | Freq: Once | OROMUCOSAL | Status: AC

## 2021-03-31 MED ORDER — FENTANYL CITRATE (PF) 100 MCG/2ML IJ SOLN
INTRAMUSCULAR | Status: DC | PRN
Start: 2021-03-31 — End: 2021-03-31
  Administered 2021-03-31: 100 ug via INTRAVENOUS

## 2021-03-31 MED ORDER — OXYCODONE HCL 5 MG PO TABS: 5.0000 mg | ORAL_TABLET | Freq: Four times a day (QID) | ORAL | 0 refills | Status: DC | PRN

## 2021-03-31 MED ORDER — AMISULPRIDE (ANTIEMETIC) 5 MG/2ML IV SOLN: 10.0000 mg | Freq: Once | INTRAVENOUS | Status: DC | PRN

## 2021-03-31 MED ORDER — DEXAMETHASONE SODIUM PHOSPHATE 10 MG/ML IJ SOLN
INTRAMUSCULAR | Status: AC
  Filled 2021-03-31: qty 1

## 2021-03-31 MED ORDER — CEFAZOLIN SODIUM-DEXTROSE 2-4 GM/100ML-% IV SOLN
2.0000 g | INTRAVENOUS | Status: AC
  Administered 2021-03-31: 2 g via INTRAVENOUS
  Filled 2021-03-31: qty 100

## 2021-03-31 MED ORDER — ONDANSETRON HCL 4 MG/2ML IJ SOLN
INTRAMUSCULAR | Status: DC | PRN
  Administered 2021-03-31: 4 mg via INTRAVENOUS

## 2021-03-31 MED ORDER — ACETAMINOPHEN 500 MG PO TABS
1000.0000 mg | ORAL_TABLET | ORAL | Status: AC
  Administered 2021-03-31: 1000 mg via ORAL
  Filled 2021-03-31: qty 2

## 2021-03-31 MED ORDER — MIDAZOLAM HCL 2 MG/2ML IJ SOLN
INTRAMUSCULAR | Status: AC
  Filled 2021-03-31: qty 2

## 2021-03-31 MED ORDER — EPHEDRINE SULFATE-NACL 50-0.9 MG/10ML-% IV SOSY
PREFILLED_SYRINGE | INTRAVENOUS | Status: DC | PRN
  Administered 2021-03-31: 15 mg via INTRAVENOUS

## 2021-03-31 MED ORDER — FENTANYL CITRATE PF 50 MCG/ML IJ SOSY
PREFILLED_SYRINGE | INTRAMUSCULAR | Status: AC
  Filled 2021-03-31: qty 1

## 2021-03-31 MED ORDER — ROCURONIUM BROMIDE 10 MG/ML (PF) SYRINGE
PREFILLED_SYRINGE | INTRAVENOUS | Status: AC
  Filled 2021-03-31: qty 10

## 2021-03-31 MED ORDER — DEXAMETHASONE SODIUM PHOSPHATE 10 MG/ML IJ SOLN
INTRAMUSCULAR | Status: DC | PRN
  Administered 2021-03-31: 10 mg via INTRAVENOUS

## 2021-03-31 MED ORDER — BUPIVACAINE LIPOSOME 1.3 % IJ SUSP: 20.0000 mL | Freq: Once | INTRAMUSCULAR | Status: DC

## 2021-03-31 MED ORDER — ONDANSETRON HCL 4 MG/2ML IJ SOLN
INTRAMUSCULAR | Status: AC
  Filled 2021-03-31: qty 2

## 2021-03-31 MED ORDER — KETAMINE HCL 10 MG/ML IJ SOLN
INTRAMUSCULAR | Status: DC | PRN
  Administered 2021-03-31: 30 mg via INTRAVENOUS

## 2021-03-31 MED ORDER — SUGAMMADEX SODIUM 500 MG/5ML IV SOLN
INTRAVENOUS | Status: DC | PRN
  Administered 2021-03-31: 400 mg via INTRAVENOUS

## 2021-03-31 MED ORDER — KETOROLAC TROMETHAMINE 15 MG/ML IJ SOLN
INTRAMUSCULAR | Status: AC
  Filled 2021-03-31: qty 1

## 2021-03-31 MED ORDER — PROPOFOL 10 MG/ML IV BOLUS
INTRAVENOUS | Status: DC | PRN
  Administered 2021-03-31: 200 mg via INTRAVENOUS

## 2021-03-31 MED ORDER — KETAMINE HCL 50 MG/5ML IJ SOSY
PREFILLED_SYRINGE | INTRAMUSCULAR | Status: AC
Start: 2021-03-31 — End: ?
  Filled 2021-03-31: qty 5

## 2021-03-31 MED ORDER — SUGAMMADEX SODIUM 500 MG/5ML IV SOLN
INTRAVENOUS | Status: AC
  Filled 2021-03-31: qty 5

## 2021-03-31 MED ORDER — FENTANYL CITRATE (PF) 100 MCG/2ML IJ SOLN
INTRAMUSCULAR | Status: AC
  Filled 2021-03-31: qty 2

## 2021-03-31 MED ORDER — KETOROLAC TROMETHAMINE 15 MG/ML IJ SOLN
15.0000 mg | Freq: Once | INTRAMUSCULAR | Status: AC | PRN
  Administered 2021-03-31: 15 mg via INTRAVENOUS

## 2021-03-31 MED ORDER — SODIUM CHLORIDE (PF) 0.9 % IJ SOLN
INTRAMUSCULAR | Status: DC | PRN
  Administered 2021-03-31: 10 mL via INTRAVENOUS

## 2021-03-31 MED ORDER — MIDAZOLAM HCL 5 MG/5ML IJ SOLN
INTRAMUSCULAR | Status: DC | PRN
  Administered 2021-03-31: 2 mg via INTRAVENOUS

## 2021-03-31 MED ORDER — PROPOFOL 10 MG/ML IV BOLUS
INTRAVENOUS | Status: AC
  Filled 2021-03-31: qty 20

## 2021-03-31 MED ORDER — SCOPOLAMINE 1 MG/3DAYS TD PT72
1.0000 | MEDICATED_PATCH | TRANSDERMAL | Status: DC
  Administered 2021-03-31: 1.5 mg via TRANSDERMAL
  Filled 2021-03-31: qty 1

## 2021-03-31 MED ORDER — PHENYLEPHRINE 40 MCG/ML (10ML) SYRINGE FOR IV PUSH (FOR BLOOD PRESSURE SUPPORT)
PREFILLED_SYRINGE | INTRAVENOUS | Status: DC | PRN
  Administered 2021-03-31: 80 ug via INTRAVENOUS

## 2021-03-31 MED ORDER — LIDOCAINE 2% (20 MG/ML) 5 ML SYRINGE
INTRAMUSCULAR | Status: DC | PRN
  Administered 2021-03-31: 60 mg via INTRAVENOUS

## 2021-03-31 MED ORDER — CHLORHEXIDINE GLUCONATE CLOTH 2 % EX PADS: 6.0000 | MEDICATED_PAD | Freq: Once | CUTANEOUS | Status: DC

## 2021-03-31 MED ORDER — LACTATED RINGERS IV SOLN
INTRAVENOUS | Status: DC
  Administered 2021-03-31: 1000 mL via INTRAVENOUS

## 2021-03-31 MED ORDER — FENTANYL CITRATE PF 50 MCG/ML IJ SOSY
25.0000 ug | PREFILLED_SYRINGE | INTRAMUSCULAR | Status: DC | PRN
  Administered 2021-03-31: 50 ug via INTRAVENOUS

## 2021-03-31 MED ORDER — BUPIVACAINE LIPOSOME 1.3 % IJ SUSP
INTRAMUSCULAR | Status: DC | PRN
  Administered 2021-03-31: 20 mL

## 2021-03-31 MED ORDER — CHLORHEXIDINE GLUCONATE 0.12 % MT SOLN
15.0000 mL | Freq: Once | OROMUCOSAL | Status: AC
  Administered 2021-03-31: 15 mL via OROMUCOSAL

## 2021-03-31 MED ORDER — ROCURONIUM BROMIDE 10 MG/ML (PF) SYRINGE
PREFILLED_SYRINGE | INTRAVENOUS | Status: DC | PRN
  Administered 2021-03-31: 20 mg via INTRAVENOUS
  Administered 2021-03-31: 60 mg via INTRAVENOUS
  Administered 2021-03-31 (×2): 20 mg via INTRAVENOUS

## 2021-03-31 SURGICAL SUPPLY — 56 items
APPLICATOR COTTON TIP 6 STRL (MISCELLANEOUS) ×2 IMPLANT
APPLICATOR COTTON TIP 6IN STRL (MISCELLANEOUS) ×4
BAG COUNTER SPONGE SURGICOUNT (BAG) IMPLANT
BLADE SURG 15 STRL LF DISP TIS (BLADE) ×1 IMPLANT
BLADE SURG 15 STRL SS (BLADE) ×1
CANNULA REDUC XI 12-8 STAPL (CANNULA) ×1
CANNULA REDUCER 12-8 DVNC XI (CANNULA) ×1 IMPLANT
CHLORAPREP W/TINT 26 (MISCELLANEOUS) ×2 IMPLANT
COVER MAYO STAND STRL (DRAPES) ×2 IMPLANT
COVER SURGICAL LIGHT HANDLE (MISCELLANEOUS) ×2 IMPLANT
COVER TIP SHEARS 8 DVNC (MISCELLANEOUS) ×1 IMPLANT
COVER TIP SHEARS 8MM DA VINCI (MISCELLANEOUS) ×1
DRAPE ARM DVNC X/XI (DISPOSABLE) ×3 IMPLANT
DRAPE COLUMN DVNC XI (DISPOSABLE) ×1 IMPLANT
DRAPE DA VINCI XI ARM (DISPOSABLE) ×3
DRAPE DA VINCI XI COLUMN (DISPOSABLE) ×1
ELECT REM PT RETURN 15FT ADLT (MISCELLANEOUS) ×2 IMPLANT
GLOVE SURG ENC TEXT LTX SZ8 (GLOVE) ×4 IMPLANT
GOWN STRL REUS W/ TWL XL LVL3 (GOWN DISPOSABLE) ×3 IMPLANT
GOWN STRL REUS W/TWL XL LVL3 (GOWN DISPOSABLE) ×3
GRASPER SUT TROCAR 14GX15 (MISCELLANEOUS) ×2 IMPLANT
IRRIG SUCT STRYKERFLOW 2 WTIP (MISCELLANEOUS) ×2
IRRIGATION SUCT STRKRFLW 2 WTP (MISCELLANEOUS) ×1 IMPLANT
KIT BASIN OR (CUSTOM PROCEDURE TRAY) ×2 IMPLANT
KIT TURNOVER KIT A (KITS) IMPLANT
MARKER SKIN DUAL TIP RULER LAB (MISCELLANEOUS) ×2 IMPLANT
MESH 3DMAX 4X6 LT LRG (Mesh General) ×1 IMPLANT
NEEDLE HYPO 22GX1.5 SAFETY (NEEDLE) ×2 IMPLANT
OBTURATOR OPTICAL STANDARD 8MM (TROCAR) ×1
OBTURATOR OPTICAL STND 8 DVNC (TROCAR) ×1
OBTURATOR OPTICALSTD 8 DVNC (TROCAR) ×1 IMPLANT
PACK CARDIOVASCULAR III (CUSTOM PROCEDURE TRAY) ×2 IMPLANT
PAD POSITIONING PINK XL (MISCELLANEOUS) ×2 IMPLANT
SCISSORS LAP 5X35 DISP (ENDOMECHANICALS) IMPLANT
SEAL CANN UNIV 5-8 DVNC XI (MISCELLANEOUS) ×2 IMPLANT
SEAL XI 5MM-8MM UNIVERSAL (MISCELLANEOUS) ×2
SOL ANTI FOG 6CC (MISCELLANEOUS) ×1 IMPLANT
SOLUTION ANTI FOG 6CC (MISCELLANEOUS) ×1
SOLUTION ELECTROLUBE (MISCELLANEOUS) ×2 IMPLANT
SPIKE FLUID TRANSFER (MISCELLANEOUS) ×2 IMPLANT
SPONGE T-LAP 18X18 ~~LOC~~+RFID (SPONGE) ×2 IMPLANT
STAPLER CANNULA SEAL DVNC XI (STAPLE) ×1 IMPLANT
STAPLER CANNULA SEAL XI (STAPLE) ×1
SUT MNCRL AB 4-0 PS2 18 (SUTURE) ×4 IMPLANT
SUT VIC AB 2-0 SH 27 (SUTURE) ×2
SUT VIC AB 2-0 SH 27X BRD (SUTURE) ×1 IMPLANT
SUT VICRYL 0 TIES 12 18 (SUTURE) ×2 IMPLANT
SUT VLOC 180 2-0 6IN GS21 (SUTURE) ×2 IMPLANT
SYR 10ML ECCENTRIC (SYRINGE) ×2 IMPLANT
SYR 20ML LL LF (SYRINGE) ×2 IMPLANT
TAPE STRIPS DRAPE STRL (GAUZE/BANDAGES/DRESSINGS) ×2 IMPLANT
TOWEL OR 17X26 10 PK STRL BLUE (TOWEL DISPOSABLE) ×2 IMPLANT
TOWEL OR NON WOVEN STRL DISP B (DISPOSABLE) ×2 IMPLANT
TROCAR ADV FIXATION 12X100MM (TROCAR) IMPLANT
TROCAR BLADELESS OPT 5 100 (ENDOMECHANICALS) ×2 IMPLANT
TUBING INSUFFLATION 10FT LAP (TUBING) ×2 IMPLANT

## 2021-03-31 NOTE — Interval H&P Note (Signed)
History and Physical Interval Note: ? ?03/31/2021 ?9:46 AM ? ?Henry Fleming  has presented today for surgery, with the diagnosis of LEFT INGUINAL HERNIA.  The various methods of treatment have been discussed with the patient and family. After consideration of risks, benefits and other options for treatment, the patient has consented to  Procedure(s): ?XI ROBOTIC [place] (Left) as a surgical intervention.  The patient's history has been reviewed, patient examined, no change in status, stable for surgery.  I have reviewed the patient's chart and labs.  Questions were answered to the patient's satisfaction.   ? ? ?Pedro Earls ? ? ?

## 2021-03-31 NOTE — Anesthesia Postprocedure Evaluation (Signed)
Anesthesia Post Note ? ?Patient: Henry Fleming ? ?Procedure(s) Performed: XI ROBOTIC ASSISTED LEFT INGUINAL HERNIA REPAIR WITH MESH (Left) ? ?  ? ?Patient location during evaluation: PACU ?Anesthesia Type: General ?Level of consciousness: awake ?Pain management: pain level controlled ?Vital Signs Assessment: post-procedure vital signs reviewed and stable ?Respiratory status: spontaneous breathing, nonlabored ventilation, respiratory function stable and patient connected to nasal cannula oxygen ?Cardiovascular status: blood pressure returned to baseline and stable ?Postop Assessment: no apparent nausea or vomiting ?Anesthetic complications: no ? ? ?No notable events documented. ? ?Last Vitals:  ?Vitals:  ? 03/31/21 1500 03/31/21 1520  ?BP: 135/67 122/64  ?Pulse: 77 75  ?Resp: 19 16  ?Temp:  36.5 ?C  ?SpO2: 94% 96%  ?  ?Last Pain:  ?Vitals:  ? 03/31/21 1520  ?TempSrc:   ?PainSc: 1   ? ? ?  ?  ?  ?  ?  ?  ? ?Henry Fleming Henry Fleming ? ? ? ? ?

## 2021-03-31 NOTE — Anesthesia Procedure Notes (Signed)
Procedure Name: Intubation ?Date/Time: 03/31/2021 10:25 AM ?Performed by: Gerald Leitz, CRNA ?Pre-anesthesia Checklist: Patient identified, Patient being monitored, Timeout performed, Emergency Drugs available and Suction available ?Patient Re-evaluated:Patient Re-evaluated prior to induction ?Oxygen Delivery Method: Circle system utilized ?Preoxygenation: Pre-oxygenation with 100% oxygen ?Induction Type: IV induction ?Ventilation: Mask ventilation without difficulty ?Laryngoscope Size: Mac and 3 ?Grade View: Grade I ?Tube type: Oral ?Tube size: 7.5 mm ?Number of attempts: 1 ?Airway Equipment and Method: Stylet ?Placement Confirmation: ETT inserted through vocal cords under direct vision, positive ETCO2 and breath sounds checked- equal and bilateral ?Secured at: 21 cm ?Tube secured with: Tape ?Dental Injury: Teeth and Oropharynx as per pre-operative assessment  ? ? ? ? ?

## 2021-03-31 NOTE — Op Note (Signed)
Henry Fleming  January 11, 1956 ? ? ?03/31/2021 ? ? ? ?PCP:  Lewis Moccasin, MD ? ? ?Surgeon: Wenda Low, MD, FACS ? ?Asst:  none ? ?Anes:  general ? ?Preop Dx: LIH ?Postop Dx: LIH sliding  ? ?Procedure: Robotic Xi [name] with Large 3 D Max mesh ?Location Surgery: [facility] 5 ?Complications:  none ? ?EBL:   minimal cc ? ?Drains: none ? ?Description of Procedure: ? The patient was taken to OR 5 .  After anesthesia was administered and the patient was prepped  with chloroprep  and a timeout was performed.  Access to the abdomen was obtained with a 5 mm Optiview on the left side at the level of the what would be the 8 mm trocar.  Another right was placed slightly to the left of the umbilicus which had a fatty umbilical hernia.  In the right upper quadrant a 12 mm was placed about 12 cm from the midline 8.  I also surveyed the abdomen and looked his gallbladder and took a picture that where he has gallstones.  I also took pictures of his right inguinal region which did not show a hernia in the left inguinal hernia which was symptomatic and fairly large. ? ?I had measured about 8 cm up from the edge of the hernia I described the anterior flap going and medially first getting into the parietal layer along the posterior surface of the rectus taking this dissection down to the pubic bone and then going more lateral on the left.  Carried the peritoneal opening laterally and then began laterally sweeping it to the midline into the lower encountered the traversing fascial break between the visceral compartment and the parietal compartment and then divided that down to the hernia sac.  The hernia sac turned out to be fairly large and was sliding medially with a fatty mass of off the colon.  I began the dissection of the sac and began just carefully teasing away from the surrounding structures and mobilized a lipoma along the cord got them mobilized and then continued this dissection.  I did get into it distally and then later closed  that. ? ?Identified the vas deferens which I saw when I was separating it from the cord structures and the vessels were in situ as well.  When I was satisfied that it was completely reduced I went back and examined from the pubic bone along Cooper's ligament and then carrying that out laterally that I had a good groove.  I previously placed a 3D max mesh and laid that into place.  Sutured it medially to the pubic bone with a Vicryl suture and then I tacked it superiorly with a another Vicryl and then closed the peritoneal flap with a running V-Loc from medial to lateral.  Laterally there was a little area of of a gap and I subsequently closed that with a another Vicryl suture.  I also went down and do the hernia sac and I sutured it to itself and closed the little rent that I had noticed in it.  Colon and everything looked good in that area.  The repair was intact.  I did lower the pressure when I closed the peritoneum.  I surveyed the abdomen and everything appeared to be in order.  I repaired the 12 mm trocar with 2 sutures of 0 Vicryl.  I injected a tap block at the beginning of the case with Exparel both in the left anterior superior iliac spine region and then  in the different ports.  Wounds were closed with 4-0 Monocryl and Dermabond ? ?The patient tolerated the procedure well and was taken to the PACU in stable condition.   ? ? ?Matt B. Daphine Deutscher, MD, FACS ?[facility], [place] ?[phone]  ?

## 2021-03-31 NOTE — Anesthesia Preprocedure Evaluation (Addendum)
Anesthesia Evaluation  ?Patient identified by MRN, date of birth, ID band ?Patient awake ? ? ? ?Reviewed: ?Allergy & Precautions, NPO status , Patient's Chart, lab work & pertinent test results ? ?Airway ?Mallampati: III ? ?TM Distance: >3 FB ?Neck ROM: Full ? ? ? Dental ?no notable dental hx. ? ?  ?Pulmonary ?sleep apnea and Continuous Positive Airway Pressure Ventilation ,  ?  ?Pulmonary exam normal ?breath sounds clear to auscultation ? ? ? ? ? ? Cardiovascular ?hypertension, Pt. on medications ?Normal cardiovascular exam+ dysrhythmias Supra Ventricular Tachycardia  ?Rhythm:Regular Rate:Normal ? ?ECG: NSR, rate 68 ?  ?Neuro/Psych ?Anxiety  Neuromuscular disease   ? GI/Hepatic ?negative GI ROS, Neg liver ROS,   ?Endo/Other  ?negative endocrine ROS ? Renal/GU ?Renal disease  ? ?  ?Musculoskeletal ?negative musculoskeletal ROS ?(+)  ? Abdominal ?  ?Peds ? Hematology ?negative hematology ROS ?(+)   ?Anesthesia Other Findings ?LEFT INGUINAL HERNIA ? Reproductive/Obstetrics ? ?  ? ? ? ? ? ? ? ? ? ? ? ? ? ?  ?  ? ? ? ? ? ? ? ?Anesthesia Physical ?Anesthesia Plan ? ?ASA: 2 ? ?Anesthesia Plan: General  ? ?Post-op Pain Management:   ? ?Induction: Intravenous ? ?PONV Risk Score and Plan: 2 and Ondansetron, Dexamethasone, Midazolam and Treatment may vary due to age or medical condition ? ?Airway Management Planned: Oral ETT ? ?Additional Equipment:  ? ?Intra-op Plan:  ? ?Post-operative Plan: Extubation in OR ? ?Informed Consent: I have reviewed the patients History and Physical, chart, labs and discussed the procedure including the risks, benefits and alternatives for the proposed anesthesia with the patient or authorized representative who has indicated his/her understanding and acceptance.  ? ? ? ?Dental advisory given ? ?Plan Discussed with: CRNA ? ?Anesthesia Plan Comments:   ? ? ? ? ? ?Anesthesia Quick Evaluation ? ?

## 2021-03-31 NOTE — Transfer of Care (Signed)
Immediate Anesthesia Transfer of Care Note ? ?Patient: Henry Fleming ? ?Procedure(s) Performed: Procedure(s): ?XI ROBOTIC ASSISTED LEFT INGUINAL HERNIA REPAIR WITH MESH (Left) ? ?Patient Location: PACU ? ?Anesthesia Type:General ? ?Level of Consciousness: Alert, Awake, Oriented ? ?Airway & Oxygen Therapy: Patient Spontanous Breathing ? ?Post-op Assessment: Report given to RN ? ?Post vital signs: Reviewed and stable ? ?Last Vitals:  ?Vitals:  ? 03/31/21 0811  ?BP: (!) 168/75  ?Pulse: 69  ?Resp: 15  ?Temp: 36.7 ?C  ?SpO2: 98%  ? ? ?Complications: No apparent anesthesia complications ? ?

## 2021-04-03 ENCOUNTER — Encounter (HOSPITAL_COMMUNITY): Payer: Self-pay | Admitting: Surgery

## 2021-12-21 ENCOUNTER — Other Ambulatory Visit (HOSPITAL_BASED_OUTPATIENT_CLINIC_OR_DEPARTMENT_OTHER): Payer: Self-pay

## 2021-12-21 MED ORDER — AREXVY 120 MCG/0.5ML IM SUSR
INTRAMUSCULAR | 0 refills | Status: DC
  Filled 2021-12-21: qty 0.5, 1d supply, fill #0

## 2021-12-22 ENCOUNTER — Other Ambulatory Visit (HOSPITAL_BASED_OUTPATIENT_CLINIC_OR_DEPARTMENT_OTHER): Payer: Self-pay

## 2022-04-24 ENCOUNTER — Encounter: Payer: Self-pay | Admitting: Internal Medicine

## 2022-05-18 ENCOUNTER — Encounter: Payer: Self-pay | Admitting: Internal Medicine

## 2022-05-18 ENCOUNTER — Ambulatory Visit (AMBULATORY_SURGERY_CENTER): Payer: Medicare Other

## 2022-05-18 VITALS — Ht 75.0 in | Wt 245.0 lb

## 2022-05-18 DIAGNOSIS — Z1211 Encounter for screening for malignant neoplasm of colon: Secondary | ICD-10-CM

## 2022-05-18 MED ORDER — NA SULFATE-K SULFATE-MG SULF 17.5-3.13-1.6 GM/177ML PO SOLN: 1.0000 | Freq: Once | ORAL | 0 refills | Status: AC

## 2022-05-18 NOTE — Progress Notes (Signed)
Pre visit completed via phone call; Patient verified name, DOB, and address;  No egg or soy allergy known to patient;  No issues known to pt with past sedation with any surgeries or procedures; Patient denies ever being told they had issues or difficulty with intubation;  No FH of Malignant Hyperthermia; Pt is not on diet pills; Pt is not on home 02;  Pt is not on blood thinners;  Pt denies issues with constipation;  No A fib or A flutter; Have any cardiac testing pending--NO Pt instructed to use Singlecare[employer]com or GoodRx for a price reduction on prep;   Insurance verified during PV appt=Medicare A/B and Database administrator Supplement, Medicare part D  Patient's chart reviewed by Cathlyn Parsons CNRA prior to previsit and patient appropriate for the [facility].  Previsit completed and red dot placed by patient's name on their procedure day (on provider's schedule).    GoodRx information entered into RX to pharmacy; Instructions sent to patient via MyChart;

## 2022-06-20 ENCOUNTER — Encounter: Payer: Self-pay | Admitting: Internal Medicine

## 2022-06-20 ENCOUNTER — Ambulatory Visit (AMBULATORY_SURGERY_CENTER): Payer: Medicare Other | Admitting: Internal Medicine

## 2022-06-20 VITALS — BP 114/57 | HR 62 | Temp 97.8°F | Resp 15 | Ht 75.0 in | Wt 245.0 lb

## 2022-06-20 DIAGNOSIS — D123 Benign neoplasm of transverse colon: Secondary | ICD-10-CM

## 2022-06-20 DIAGNOSIS — Z1211 Encounter for screening for malignant neoplasm of colon: Secondary | ICD-10-CM | POA: Diagnosis not present

## 2022-06-20 DIAGNOSIS — K635 Polyp of colon: Secondary | ICD-10-CM | POA: Diagnosis not present

## 2022-06-20 MED ORDER — SODIUM CHLORIDE 0.9 % IV SOLN: 500.0000 mL | Freq: Once | INTRAVENOUS | Status: DC

## 2022-06-20 NOTE — Progress Notes (Signed)
GASTROENTEROLOGY PROCEDURE H&P NOTE   Primary Care Physician: Lewis Moccasin, MD    Reason for Procedure:   Colon cancer screening  Plan:    Colonoscopy  Patient is appropriate for endoscopic procedure(s) in the ambulatory ([facility]) setting.  The nature of the procedure, as well as the risks, benefits, and alternatives were carefully and thoroughly reviewed with the patient. Ample time for discussion and questions allowed. The patient understood, was satisfied, and agreed to proceed.     HPI: Henry Fleming is a 67 y.o. male who presents for colonoscopy for colon cancer screening. Denies blood in stools, changes in bowel habits, or unintentional weight loss. Has a first cousin diagnosed with colon cancer at age 35. Last colonoscopy 12 years ago was normal.  Past Medical History:  Diagnosis Date   Anxiety    Body mass index (BMI) 30.0-30.9, adult    BPH (benign prostatic hyperplasia)    Gallstone    asymptomatic   Hemorrhoid    s/p banding   History of kidney stones    HTN (hypertension)    on meds   Hypertriglyceridemia    on meds   Nephrolithiasis    Paroxysmal SVT (supraventricular tachycardia)    Prediabetes    impaired fasting glucose   Sciatica    resolved   Seasonal allergies    Sleep apnea    uses CPAP   Tinnitus    Vitamin D deficiency     Past Surgical History:  Procedure Laterality Date   DE QUERVAIN'S RELEASE     HEMORRHOID BANDING  2016   x 3   WISDOM TOOTH EXTRACTION     XI ROBOTIC ASSISTED INGUINAL HERNIA REPAIR WITH MESH Left 03/31/2021   Procedure: XI ROBOTIC ASSISTED LEFT INGUINAL HERNIA REPAIR WITH MESH;  Surgeon: Luretha Murphy, MD;  Location: [facility];  Service: General;  Laterality: Left;    Prior to Admission medications   Medication Sig Start Date End Date Taking? Authorizing Provider  Alpha-Lipoic Acid 600 MG TABS Take 600 mg by mouth daily.    [provider]  aspirin EC 81 MG tablet Take 81 mg by mouth at bedtime.  Swallow whole.    [provider]  candesartan-hydrochlorothiazide (ATACAND HCT) 32-12.5 MG tablet Take 1 tablet by mouth daily. 07/28/19 05/18/22  [provider]  Cholecalciferol 1.25 MG (50000 UT) capsule Take 50,000 Units by mouth every Sunday.    [provider]  Coenzyme Q10 (COQ10) 200 MG CAPS Take 200 mg by mouth daily.    [provider]  diltiazem (CARDIZEM) 30 MG tablet Take 1 tablet (30 mg total) by mouth as needed (1-2 tablets every 6 hours for fast heart rate.). 01/25/20 05/18/22  Allred, Fayrene Fearing, MD  escitalopram (LEXAPRO) 10 MG tablet Take 1 tablet by mouth daily. 08/08/16 05/18/22  [provider]  icosapent Ethyl (VASCEPA) 1 g capsule Take 4 capsules by mouth daily. 01/26/19 05/18/22  [provider]  Lutein-Zeaxanthin 20-1 MG CAPS Take 1 capsule by mouth daily.    [provider]  Multiple Vitamin (MULTIVITAMIN ADULT) TABS Take 1 tablet by mouth daily.    [provider]  OVER THE COUNTER MEDICATION Take 1 capsule by mouth daily. ULTRA PROSTATE FORMULA    [provider]  pioglitazone (ACTOS) 15 MG tablet Take 15 mg by mouth daily.    [provider]  rosuvastatin (CRESTOR) 20 MG tablet Take 1 tablet by mouth daily. 01/30/19 05/18/22  [provider]  Tirzepatide-Weight Management (  ZEPBOUND [place]) once a week. 05/09/22   [provider]    Current Outpatient Medications  Medication Sig Dispense Refill   Alpha-Lipoic Acid 600 MG TABS Take 600 mg by mouth daily.     aspirin EC 81 MG tablet Take 81 mg by mouth at bedtime. Swallow whole.     candesartan-hydrochlorothiazide (ATACAND HCT) 32-12.5 MG tablet Take 1 tablet by mouth daily.     Cholecalciferol 1.25 MG (50000 UT) capsule Take 50,000 Units by mouth every Sunday.     Coenzyme Q10 (COQ10) 200 MG CAPS Take 200 mg by mouth daily.     diltiazem (CARDIZEM) 30 MG tablet Take 1 tablet (30 mg total) by mouth as needed (1-2 tablets every 6 hours  for fast heart rate.). 60 tablet 3   escitalopram (LEXAPRO) 10 MG tablet Take 1 tablet by mouth daily.     icosapent Ethyl (VASCEPA) 1 g capsule Take 4 capsules by mouth daily.     Lutein-Zeaxanthin 20-1 MG CAPS Take 1 capsule by mouth daily.     Multiple Vitamin (MULTIVITAMIN ADULT) TABS Take 1 tablet by mouth daily.     OVER THE COUNTER MEDICATION Take 1 capsule by mouth daily. ULTRA PROSTATE FORMULA     pioglitazone (ACTOS) 15 MG tablet Take 15 mg by mouth daily.     rosuvastatin (CRESTOR) 20 MG tablet Take 1 tablet by mouth daily.     Tirzepatide-Weight Management (ZEPBOUND [place]) once a week.     Current Facility-Administered Medications  Medication Dose Route Frequency Provider Last Rate Last Admin   0.9 %  sodium chloride infusion  500 mL Intravenous Once Imogene Burn, MD        Allergies as of 06/20/2022 - Review Complete 06/20/2022  Allergen Reaction Noted   Ace inhibitors Cough 04/11/2016   Ramipril Cough 04/11/2016    Family History  Problem Relation Age of Onset   High Cholesterol Mother    Diabetes Mother    Heart Problems Mother        AFIB, AORTIC STENOSIS   Alzheimer's disease Mother    High blood pressure Father    Heart Problems Father        AFIB, AORTIC STENOSIS   Thyroid disease Father    Heart Problems Sister    High blood pressure Sister    Thyroid cancer Sister    Atrial fibrillation Sister    Heart Problems Brother    High blood pressure Brother    Atrial fibrillation Brother    Thyroid disease Son    Diabetes Son    High Cholesterol Son    Colon polyps Neg Hx    Colon cancer Neg Hx    Esophageal cancer Neg Hx    Stomach cancer Neg Hx    Rectal cancer Neg Hx     Social History   Socioeconomic History   Marital status: Married    Spouse name: Not on file   Number of children: Not on file   Years of education: Not on file   Highest education level: Not on file  Occupational History   Not on file  Tobacco Use   Smoking status: Never    Smokeless tobacco: Never  Vaping Use   Vaping Use: Never used  Substance and Sexual Activity   Alcohol use: Yes    Alcohol/week: 0.0 - 1.0 standard drinks of alcohol    Comment: rare   Drug use: Never   Sexual activity: Not on file  Other Topics Concern  Not on file  Social History Narrative   Lives in [place] with spouse.  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   Social Determinants of Corporate investment banker Strain: Not on file  Food Insecurity: Not on file  Transportation Needs: Not on file  Physical Activity: Not on file  Stress: Not on file  Social Connections: Not on file  Intimate Partner Violence: Not on file    Physical Exam: Vital signs in last 24 hours: BP (!) 113/54   Pulse 67   Temp 97.8 F (36.6 C)   Ht 6\' 3"  (1.905 m)   Wt 245 lb (111.1 kg)   SpO2 96%   BMI 30.62 kg/m  GEN: NAD EYE: Sclerae anicteric ENT: MMM CV: Non-tachycardic Pulm: No increased work of breathing GI: Soft, NT/ND NEURO:  Alert & Oriented   Eulah Pont, MD [facility]  06/20/2022 10:54 AM

## 2022-06-20 NOTE — Progress Notes (Signed)
Called to room to assist during endoscopic procedure.  Patient ID and intended procedure confirmed with present staff. Received instructions for my participation in the procedure from the performing physician.  

## 2022-06-20 NOTE — Progress Notes (Signed)
Pt's states no medical or surgical changes since previsit or office visit. 

## 2022-06-20 NOTE — Progress Notes (Signed)
A and O x3. Report to RN. Tolerated MAC anesthesia well. 

## 2022-06-20 NOTE — Patient Instructions (Signed)
Handout on polyps and diverticulosis given.    YOU HAD AN ENDOSCOPIC PROCEDURE TODAY AT THE [facility]:   Refer to the procedure report that was given to you for any specific questions about what was found during the examination.  If the procedure report does not answer your questions, please call your gastroenterologist to clarify.  If you requested that your care partner not be given the details of your procedure findings, then the procedure report has been included in a sealed envelope for you to review at your convenience later.  YOU SHOULD EXPECT: Some feelings of bloating in the abdomen. Passage of more gas than usual.  Walking can help get rid of the air that was put into your GI tract during the procedure and reduce the bloating. If you had a lower endoscopy (such as a colonoscopy or flexible sigmoidoscopy) you may notice spotting of blood in your stool or on the toilet paper. If you underwent a bowel prep for your procedure, you may not have a normal bowel movement for a few days.  Please Note:  You might notice some irritation and congestion in your nose or some drainage.  This is from the oxygen used during your procedure.  There is no need for concern and it should clear up in a day or so.  SYMPTOMS TO REPORT IMMEDIATELY:  Following lower endoscopy (colonoscopy or flexible sigmoidoscopy):  Excessive amounts of blood in the stool  Significant tenderness or worsening of abdominal pains  Swelling of the abdomen that is new, acute  Fever of 100F or higher   For urgent or emergent issues, a gastroenterologist can be reached at any hour by calling [phone]. Do not use MyChart messaging for urgent concerns.    DIET:  We do recommend a small meal at first, but then you may proceed to your regular diet.  Drink plenty of fluids but you should avoid alcoholic beverages for 24 hours.  ACTIVITY:  You should plan to take it easy for the rest of today and you should NOT  DRIVE or use heavy machinery until tomorrow (because of the sedation medicines used during the test).    FOLLOW UP: Our staff will call the number listed on your records the next business day following your procedure.  We will call around 7:15- 8:00 am to check on you and address any questions or concerns that you may have regarding the information given to you following your procedure. If we do not reach you, we will leave a message.     If any biopsies were taken you will be contacted by phone or by letter within the next 1-3 weeks.  Please call us at [phone] if you have not heard about the biopsies in 3 weeks.    SIGNATURES/CONFIDENTIALITY: You and/or your care partner have signed paperwork which will be entered into your electronic medical record.  These signatures attest to the fact that that the information above on your After Visit Summary has been reviewed and is understood.  Full responsibility of the confidentiality of this discharge information lies with you and/or your care-partner. 

## 2022-06-20 NOTE — Op Note (Signed)
[facility] Patient Name: Henry Fleming Procedure Date: 06/20/2022 11:34 AM MRN: [id number] Endoscopist: Particia Lather , , [id number] Age: 67 Referring MD:  Date of Birth: 09-Jan-1956 Gender: Male Account #: [id number] Procedure:                Colonoscopy Indications:              Screening for colorectal malignant neoplasm Medicines:                Monitored Anesthesia Care Procedure:                Pre-Anesthesia Assessment:                           - Prior to the procedure, a History and Physical                            was performed, and patient medications and                            allergies were reviewed. The patient's tolerance of                            previous anesthesia was also reviewed. The risks                            and benefits of the procedure and the sedation                            options and risks were discussed with the patient.                            All questions were answered, and informed consent                            was obtained. Prior Anticoagulants: The patient has                            taken no anticoagulant or antiplatelet agents. ASA                            Grade Assessment: III - A patient with severe                            systemic disease. After reviewing the risks and                            benefits, the patient was deemed in satisfactory                            condition to undergo the procedure.                           After obtaining informed consent, the colonoscope  was passed under direct vision. Throughout the                            procedure, the patient's blood pressure, pulse, and                            oxygen saturations were monitored continuously. The                            Olympus CF-[id number] SN [id number] was introduced through                            the anus and advanced to the the terminal ileum.                            The  colonoscopy was performed without difficulty.                            The patient tolerated the procedure well. The                            quality of the bowel preparation was excellent. The                            terminal ileum, ileocecal valve, appendiceal                            orifice, and rectum were photographed. Scope In: 11:39:39 AM Scope Out: 11:53:22 AM Scope Withdrawal Time: 0 hours 11 minutes 25 seconds  Total Procedure Duration: 0 hours 13 minutes 43 seconds  Findings:                 The terminal ileum appeared normal.                           Two sessile polyps were found in the transverse                            colon. The polyps were 3 to 4 mm in size. These                            polyps were removed with a cold snare. Resection                            and retrieval were complete.                           Multiple diverticula were found in the descending                            colon and transverse colon.                           Non-bleeding internal hemorrhoids were found during  retroflexion. Complications:            No immediate complications. Estimated Blood Loss:     Estimated blood loss was minimal. Impression:               - The examined portion of the ileum was normal.                           - Two 3 to 4 mm polyps in the transverse colon,                            removed with a cold snare. Resected and retrieved.                           - Diverticulosis in the descending colon and in the                            transverse colon.                           - Non-bleeding internal hemorrhoids. Recommendation:           - Discharge patient to home (with escort).                           - Await pathology results.                           - The findings and recommendations were discussed                            with the patient. Dr Particia Lather "Ferney" [place],  06/20/2022 11:56:42 AM

## 2022-06-21 ENCOUNTER — Telehealth: Payer: Self-pay

## 2022-06-21 NOTE — Telephone Encounter (Signed)
No answer, left message to call if having any issues or concerns, B.Tracey Stewart RN 

## 2022-06-26 ENCOUNTER — Encounter: Payer: Self-pay | Admitting: Internal Medicine

## 2022-09-19 ENCOUNTER — Ambulatory Visit: Payer: Medicare Other | Attending: Cardiology | Admitting: Cardiology

## 2022-09-19 ENCOUNTER — Encounter: Payer: Self-pay | Admitting: Cardiology

## 2022-09-19 ENCOUNTER — Ambulatory Visit: Payer: Medicare Other | Attending: Cardiology

## 2022-09-19 ENCOUNTER — Ambulatory Visit: Payer: Medicare Other | Admitting: Cardiology

## 2022-09-19 VITALS — BP 122/73 | HR 67 | Ht 75.0 in | Wt 214.0 lb

## 2022-09-19 DIAGNOSIS — I10 Essential (primary) hypertension: Secondary | ICD-10-CM

## 2022-09-19 DIAGNOSIS — I471 Supraventricular tachycardia, unspecified: Secondary | ICD-10-CM

## 2022-09-19 DIAGNOSIS — G4733 Obstructive sleep apnea (adult) (pediatric): Secondary | ICD-10-CM | POA: Diagnosis present

## 2022-09-19 MED ORDER — CANDESARTAN CILEXETIL 16 MG PO TABS: 16.0000 mg | ORAL_TABLET | Freq: Every day | ORAL | 3 refills | Status: AC

## 2022-09-19 NOTE — Patient Instructions (Signed)
Medication Instructions:  Your physician has recommended you make the following change in your medication:  Stop taking Atacand  Start taking Candasartan 16 mg daily *If you need a refill on your cardiac medications before your next appointment, please call your pharmacy*   Testing/Procedures: Your physician has recommended that you wear an event monitor. Event monitors are medical devices that record the heart's electrical activity. Doctors most often [place] these monitors to diagnose arrhythmias. Arrhythmias are problems with the speed or rhythm of the heartbeat. The monitor is a small, portable device. You can wear one while you do your normal daily activities. This is usually used to diagnose what is causing palpitations/syncope (passing out).   Follow-Up: At [facility], you and your health needs are our priority.  As part of our continuing mission to provide you with exceptional heart care, we have created designated Provider Care Teams.  These Care Teams include your primary Cardiologist (physician) and Advanced Practice Providers (APPs -  Physician Assistants and Nurse Practitioners) who all work together to provide you with the care you need, when you need it.   Your next appointment:   1 year(s)  Provider:   Dr Mayford Knife  Other Instructions ZIO XT- Long Term Monitor Instructions  Your physician has requested you wear a ZIO patch monitor for 14 days.  This is a single patch monitor. [employer] supplies one patch monitor per enrollment. Additional stickers are not available. Please do not apply patch if you will be having a Nuclear Stress Test,  Echocardiogram, Cardiac CT, MRI, or Chest Xray during the period you would be wearing the  monitor. The patch cannot be worn during these tests. You cannot remove and re-apply the  ZIO XT patch monitor.  Your ZIO patch monitor will be mailed 3 day USPS to your address on file. It may take 3-5 days  to receive your monitor after you have  been enrolled.  Once you have received your monitor, please review the enclosed instructions. Your monitor  has already been registered assigning a specific monitor serial # to you.  Billing and Patient Assistance Program Information  We have supplied [employer] with any of your insurance information on file for billing purposes. [employer] offers a sliding scale Patient Assistance Program for patients that do not have  insurance, or whose insurance does not completely cover the cost of the ZIO monitor.  You must apply for the Patient Assistance Program to qualify for this discounted rate.  To apply, please call [employer] at [phone], select option 4, select option 2, ask to apply for  Patient Assistance Program. Henry Fleming will ask your household income, and how many people  are in your household. They will quote your out-of-pocket cost based on that information.  [employer] will also be able to set up a 41-month, interest-free payment plan if needed.  Applying the monitor   Shave hair from upper left chest.  Hold abrader disc by orange tab. Rub abrader in 40 strokes over the upper left chest as  indicated in your monitor instructions.  Clean area with 4 enclosed alcohol pads. Let dry.  Apply patch as indicated in monitor instructions. Patch will be placed under collarbone on left  side of chest with arrow pointing upward.  Rub patch adhesive wings for 2 minutes. Remove white label marked "1". Remove the white  label marked "2". Rub patch adhesive wings for 2 additional minutes.  While looking in a mirror, press and release button in center of patch. A  small green light will  flash 3-4 times. This will be your only indicator that the monitor has been turned on.  Do not shower for the first 24 hours. You may shower after the first 24 hours.  Press the button if you feel a symptom. You will hear a small click. Record Date, Time and  Symptom in the Patient Logbook.  When you are ready to remove the  patch, follow instructions on the last 2 pages of Patient  Logbook. Stick patch monitor onto the last page of Patient Logbook.  Place Patient Logbook in the blue and white box. Use locking tab on box and tape box closed  securely. The blue and white box has prepaid postage on it. Please place it in the mailbox as  soon as possible. Your physician should have your test results approximately 7 days after the  monitor has been mailed back to [facility].  Call [facility] Customer Care at [phone] if you have questions regarding  your ZIO XT patch monitor. Call them immediately if you see an orange light blinking on your  monitor.  If your monitor falls off in less than 4 days, contact our Monitor department at [phone].  If your monitor becomes loose or falls off after 4 days call [employer] at [phone] for  suggestions on securing your monitor

## 2022-09-19 NOTE — Addendum Note (Signed)
Addended by: Alvin Critchley A on: 09/19/2022 10:43 AM   Modules accepted: Orders

## 2022-09-19 NOTE — Progress Notes (Signed)
SLEEP MEDICINE VIRTUAL VISIT via Video Note   Because of Leelynd Romualdo Knight's co-morbid illnesses, he is at least at moderate risk for complications without adequate follow up.  This format is felt to be most appropriate for this patient at this time.  All issues noted in this document were discussed and addressed.  A limited physical exam was performed with this format.  Please refer to the patient's chart for his consent to telehealth for [facility].       Date:  09/19/2022   ID:  Henry Fleming, DOB [birth year], MRN [id number] The patient was identified using 2 identifiers.  Patient Location: Home Provider Location: Home Office   PCP:  Lewis Moccasin, MD   [facility] Providers Cardiologist:  None Sleep Medicine:  Armanda Magic, MD     Evaluation Performed:  Follow-Up Visit  Chief Complaint:  OSA  History of Present Illness:    Keavin Wonderly Hochmuth is a 67 y.o. male with  a hx of HTN, HLD and SVT.  He was referred for sleep study by Dr. Johney Frame due to SVT and complains of non restorative sleep and snoring.  He underwent home sleep study and showed severe OSA with an AHI of 38/hr and O2 sats as low as 87%.  He underwent CPAP titration successfully to 12cm H2O.  He is here today for followup and is doing well.  He has lost 40lbs since April on weight loss drugs.  Since the weight loss he has had some problems with orthostatic hypotension at times and has had some increased SVT. He tells me that he has palpitations that last a few seconds to 2 minutes occurring once weekly.  He also has another type of palpitations that can last up to 3 hours at a time but only have them every 3 months.  The second type cause him to be lightheaded and he recorded one of his BPs readings with a SBP in the 80's and HR in the 140's. He has never tried the Diltiazem short acting because he is worried about it dropping his BP.  He also has been having some problems with his BP  dropping at times.  He has dropped the BP med in half after losing a lot of weight and has noticed that if he goes from squatting to standing in the heat and beds over to pick up his dogs BMs on a walk he will feel dizzy. His BP has ranged from 108-137/63-28mmHg with an average of 122/52mmHg.  HR has been in average around 67bpm.  It is now exacerbated but the weight loss and when he is out in the heat. He denies any chest pain or pressure, SOB, DOE, PND, orthopnea, LE edema. He is compliant with his meds and is tolerating meds with no SE.    He is doing well with his PAP device and thinks that he has gotten used to it.  He tolerates the mask and feels the pressure is adequate.  Since going on PAP he feels rested in the am and has no significant daytime sleepiness.  He denies any significant mouth or nasal dryness or nasal congestion.  He does not think that he snores.     Past Medical History:  Diagnosis Date   Anxiety    Body mass index (BMI) 30.0-30.9, adult    BPH (benign prostatic hyperplasia)    Gallstone    asymptomatic   Hemorrhoid    s/p banding  History of kidney stones    HTN (hypertension)    on meds   Hypertriglyceridemia    on meds   Nephrolithiasis    Paroxysmal SVT (supraventricular tachycardia)    Prediabetes    impaired fasting glucose   Sciatica    resolved   Seasonal allergies    Sleep apnea    uses CPAP   Tinnitus    Vitamin D deficiency    Past Surgical History:  Procedure Laterality Date   DE QUERVAIN'S RELEASE     HEMORRHOID BANDING  2016   x 3   WISDOM TOOTH EXTRACTION     XI ROBOTIC ASSISTED INGUINAL HERNIA REPAIR WITH MESH Left 03/31/2021   Procedure: XI ROBOTIC ASSISTED LEFT INGUINAL HERNIA REPAIR WITH MESH;  Surgeon: Luretha Murphy, MD;  Location: [facility];  Service: General;  Laterality: Left;     No outpatient medications have been marked as taking for the 09/19/22 encounter (Video Visit) with Quintella Reichert, MD.     Allergies:   Ace  inhibitors and Ramipril   Social History   Tobacco Use   Smoking status: Never   Smokeless tobacco: Never  Vaping Use   Vaping status: Never Used  Substance Use Topics   Alcohol use: Yes    Alcohol/week: 0.0 - 1.0 standard drinks of alcohol    Comment: rare   Drug use: Never     Family Hx: The patient's family history includes Alzheimer's disease in his mother; Atrial fibrillation in his brother and sister; Diabetes in his mother and son; Heart Problems in his brother, father, mother, and sister; High Cholesterol in his mother and son; High blood pressure in his brother, father, and sister; Thyroid cancer in his sister; Thyroid disease in his father and son. There is no history of Colon polyps, Colon cancer, Esophageal cancer, Stomach cancer, or Rectal cancer.  ROS:   Please see the history of present illness.     All other systems reviewed and are negative.   Prior Sleep studies:   The following studies were reviewed today:  PAP compliance download  Labs/Other Tests and Data Reviewed:     Recent Labs: No results found for requested labs within last 365 days.   Wt Readings from Last 3 Encounters:  09/19/22 214 lb (97.1 kg)  06/20/22 245 lb (111.1 kg)  05/18/22 245 lb (111.1 kg)     Risk Assessment/Calculations:      Objective:    Vital Signs:  There were no vitals taken for this visit.   VITAL SIGNS:  reviewed GEN:  no acute distress EYES:  sclerae anicteric, EOMI - Extraocular Movements Intact RESPIRATORY:  normal respiratory effort, symmetric expansion CARDIOVASCULAR:  no peripheral edema SKIN:  no rash, lesions or ulcers. MUSCULOSKELETAL:  no obvious deformities. NEURO:  alert and oriented x 3, no obvious focal deficit PSYCH:  normal affect  ASSESSMENT & PLAN:    OSA - The patient is tolerating PAP therapy well without any problems. The PAP download performed by his DME was personally reviewed and interpreted by me today and showed an AHI of 2.5/hr on  auto CPAP from 8 to 17 cm H2O with 83% compliance in using more than 4 hours nightly.  The patient has been using and benefiting from PAP use and will continue to benefit from therapy.  -he would ultimately like to get off the CPAP if able>>he wants to try to lose another 10-15lbs and will call me then and we will assess with a HST  off CPAP  HTN -BP controlled at home -he has had some problems with orthostasis since losing weight and bending over and working out in the heat -change Atacand HCT to Atacand 16mg  daily  with PRN refills -d/c the HCT part of the Atacand HCT -encouraged him to avoid being out in extreme temperatures as well as to stay hydrated drinking at least 64oz of fluid daily -he will follow his BP readings at home  SVT -he has been having 2 types of palpitations recently one that is infrequent but lasts longer and the other occurs once monthly -I have recommended that we get a 2 week ziopatch to assess palpitations and if he has not events on that then he will gets some new batteries for his Lourena Simmonds mobile and send me any recordings he gets -may need to consider referral  back to EP -continue PRN Cardizem short acting for any breakthrough palpitations but if he has a low BP during the palpitations I have recommended that he call EMS so we can get a rhythm strip to identify the arrhythmia and also to have medical personnel to decide the best way to treat instead of taking a PRN Cardizem which may drop his BP further    Total time of encounter: 40 minutes total time of encounter, including 33 minutes spent in face-to-face patient care on the date of this encounter. This time includes coordination of care and counseling regarding above mentioned problem list. Remainder of non-face-to-face time involved reviewing chart documents/testing relevant to the patient encounter and documentation in the medical record. I have independently reviewed documentation from referring provider.     Medication Adjustments/Labs and Tests Ordered: Current medicines are reviewed at length with the patient today.  Concerns regarding medicines are outlined above.   Tests Ordered: No orders of the defined types were placed in this encounter.   Medication Changes: No orders of the defined types were placed in this encounter.   Follow Up:  In Person in 1 year(s)  Signed, Armanda Magic, MD  09/19/2022 10:20 AM    [facility]

## 2022-09-19 NOTE — Progress Notes (Unsigned)
Enrolled for [employer] to mail a ZIO XT long term holter monitor to the patients address on file.  

## 2022-09-19 NOTE — Progress Notes (Signed)
Erroneous encounter This encounter was created in error - please disregard. 

## 2022-09-20 ENCOUNTER — Encounter: Payer: Self-pay | Admitting: Cardiology

## 2022-09-21 DIAGNOSIS — I471 Supraventricular tachycardia, unspecified: Secondary | ICD-10-CM | POA: Diagnosis not present

## 2022-09-21 DIAGNOSIS — I10 Essential (primary) hypertension: Secondary | ICD-10-CM | POA: Diagnosis not present

## 2022-09-21 DIAGNOSIS — G4733 Obstructive sleep apnea (adult) (pediatric): Secondary | ICD-10-CM | POA: Diagnosis not present

## 2022-10-08 ENCOUNTER — Ambulatory Visit: Payer: Medicare Other | Admitting: Cardiology

## 2022-10-12 ENCOUNTER — Telehealth: Payer: Self-pay

## 2022-10-12 MED ORDER — METOPROLOL SUCCINATE ER 25 MG PO TB24: 25.0000 mg | ORAL_TABLET | Freq: Every day | ORAL | 3 refills | Status: DC

## 2022-10-12 NOTE — Telephone Encounter (Signed)
Call to patient to review heart monitor results. Patient agrees to start Toprol XL 25 mg daily and followup with PA in 4 weeks, appt scheduled for 11/29/22.

## 2022-10-12 NOTE — Telephone Encounter (Signed)
-----   Message from Nurse Mearl Latin sent at 10/11/2022  8:56 AM EDT -----  ----- Message ----- From: Quintella Reichert, MD Sent: 10/11/2022   8:46 AM EDT To: [facility] Triage  Please let patient know he is having more SVT that appears to be atrial tachycardia.  Start Toprol XL 25mg  daily and followup with PA in 4 weeks

## 2022-11-23 ENCOUNTER — Encounter: Payer: Self-pay | Admitting: Cardiology

## 2022-11-29 ENCOUNTER — Telehealth: Payer: Self-pay | Admitting: *Deleted

## 2022-11-29 ENCOUNTER — Encounter: Payer: Self-pay | Admitting: Physician Assistant

## 2022-11-29 ENCOUNTER — Ambulatory Visit: Payer: Medicare Other | Attending: Physician Assistant | Admitting: Physician Assistant

## 2022-11-29 VITALS — BP 124/64 | HR 61 | Ht 75.0 in | Wt 216.6 lb

## 2022-11-29 DIAGNOSIS — I471 Supraventricular tachycardia, unspecified: Secondary | ICD-10-CM

## 2022-11-29 DIAGNOSIS — G4733 Obstructive sleep apnea (adult) (pediatric): Secondary | ICD-10-CM | POA: Diagnosis present

## 2022-11-29 DIAGNOSIS — I10 Essential (primary) hypertension: Secondary | ICD-10-CM

## 2022-11-29 MED ORDER — METOPROLOL SUCCINATE ER 25 MG PO TB24: 37.5000 mg | ORAL_TABLET | Freq: Every evening | ORAL | 3 refills | Status: AC

## 2022-11-29 NOTE — Progress Notes (Signed)
Cardiology Office Note:  .   Date:  11/29/2022  ID:  Henry Fleming, DOB Oct 03, 1955, MRN [id number] PCP: Lewis Moccasin, MD  [facility] Providers Cardiologist:  Armanda Magic, MD Sleep Medicine:  Armanda Magic, MD {  History of Present Illness: Henry Fleming   Henry Fleming is a 67 y.o. male with a past medical history of hypertension, hyperlipidemia, and SVT who was referred to Dr. Mayford Knife for a sleep study.  Underwent home sleep study and showed severe OSA and underwent CPAP titration successfully to 12 cm H2O.  Was seen in follow-up video visit with Dr. Mayford Knife 9//24.  Has lost 40 pounds since April and weight loss drugs.  Since the weight loss has some problems with orthostatic hypotension at times and had some increased SVT.  Palpitations last few seconds to minutes occurring once weekly.  Also endorses noted to have palpitations that can last up to 3 hours at a time but he only has those every 3 months.  Second type causes lightheadedness and recorded one of his BPs with SBP in the 80s and heart rate in the 140s.  Never tried diltiazem short acting because he was worried about dropping his BP further.  Also had some issues with BP dropping at times.  Since he is dropped so much weight might be on too much medication.  Notices when he goes from squatting to standing in the heat and bends over to pick up his dogs BMs on the sidewalk he feels dizzy.  BPs ranged anywhere from 108-137/63-80 mmHg than average of 122/73 mmHg.  Average heart rate around 67 bpm.  PAP device was doing well.  Tolerated mask and felt pressure was adequate.  He had no significant daytime sleepiness.  He does not think that he snores.  Today, he presents with a history of Supraventricular Tachycardia (SVT), hypertension, sleep apnea, and prediabetes for a follow-up visit. The patient reports persistent episodes of SVT despite being on metoprolol. He  is considering increasing the dose of metoprolol but is concerned  about potential orthostatic symptoms and low blood pressure. He also reports significant weight loss of forty-two pounds over the past six months and is questioning the need for continuous positive airway pressure (CPAP) therapy for sleep apnea. The patient is also on vascepa for high triglycerides and has a family history of atrial fibrillation. The patient's blood pressure and heart rate have been stable on the current regimen.  Reports no shortness of breath nor dyspnea on exertion. Reports no chest pain, pressure, or tightness. No edema, orthopnea, PND.    Discussed the use of AI scribe software for clinical note transcription with the patient, who gave verbal consent to proceed.  The patient's family history includes (patient reported): Dementia in his mother (died age 2). Type 2 diabetes in his mother, father, brother and one sister. Mixed dyslipidemia in his mother and his 3 siblings. Hypertension in his mother, father, and his 3 siblings. Obesity in his mother, father, and his 3 siblings. Chronic atrial fibrillation in his mother, father and brother.  Early onset (age 54) atrial fibrillation (treated with ablations x2) in one sister. Heart failure (cause of death at age 12) in his father. Sudden cardiac death (presumed ventricular fibrillation in setting of morbid obesity, resistant hypertension, dilated concentric cardiomegaly and mild to moderate diffuse coronary artery disease) at age 58 in his brother. Autoimmune hypothyroidism in his father, both sisters and one son. Macular degeneration in his father. Colon cancer (cause of death  at age 87) in one first cousin. There is no other family history of colon polyps or colorectal cancer. There is no family history of esophageal cancer or stomach cancer.  There is no family history of thyroid cancer.   ROS: Pertinent ROS in HPI  Studies Reviewed: Henry Fleming   EKG Interpretation Date/Time:  Thursday November 29 2022 14:26:46 EST Ventricular  Rate:  61 PR Interval:  166 QRS Duration:  108 QT Interval:  418 QTC Calculation: 420 R Axis:   85  Text Interpretation: Normal sinus rhythm Nonspecific ST abnormality When compared with ECG of 31-Mar-2021 08:42, No significant change was found Confirmed by Jari Favre [phone]) on 11/29/2022 3:30:02 PM    Zio monitor 09/19/22  Predominant rhythm was normal sinus rhythm with average heart rate of 69bpm and ranged of 53 to 152bpm.   SVT up to 33.2 seconds and fastest HR 152bpm and most consistent with paroxysmal atrial tachycardia.  This correpsonded to patients complaints of palpitations.   Rare PACs, atrial couplets and triplets   Rare PVCs   LABS Thyroid panel: Normal (09/06/2022) Electrolytes: Normal (09/06/2022) Kidney function: Normal (09/06/2022) CBC: Normal (09/06/2022) Total cholesterol: 98 mg/dL (60/45/4098) Triglycerides: 146 mg/dL (11/91/4782) NFA2Z: 3.0% (09/06/2022) Fasting glucose: 77 mg/dL (86/57/8469)  Risk Assessment/Calculations:        STOP-Bang Score:  5       Physical Exam:   VS:  BP 124/64   Pulse 61   Ht 6\' 3"  (1.905 m)   Wt 216 lb 9.6 oz (98.2 kg)   SpO2 98%   BMI 27.07 kg/m    Wt Readings from Last 3 Encounters:  11/29/22 216 lb 9.6 oz (98.2 kg)  09/19/22 214 lb (97.1 kg)  06/20/22 245 lb (111.1 kg)    GEN: Well nourished, well developed in no acute distress NECK: No JVD; No carotid bruits CARDIAC: RRR, no murmurs, rubs, gallops RESPIRATORY:  Clear to auscultation without rales, wheezing or rhonchi  ABDOMEN: Soft, non-tender, non-distended EXTREMITIES:  No edema; No deformity   ASSESSMENT AND PLAN: .    Supraventricular Tachycardia (SVT) Episodes persist despite Metoprolol 25mg  at bedtime. Heart rate and blood pressure within normal limits. Discussed risk of orthostatic symptoms with increased dose. -Increase Metoprolol to 1.5 tablets at dinner time. -Monitor blood pressure and heart rate at home.  Obstructive Sleep Apnea  (OSA) Significant weight loss of 42 pounds. Currently using CPAP, but unsure if still necessary. -Order at-home sleep study to reassess need for CPAP.   Hypertriglyceridemia Well controlled with Pioglitazone and Vascepa. -Continue current regimen.  Follow-up in 6 months with Dr. Mayford Knife.      Signed, Sharlene Dory, PA-C

## 2022-11-29 NOTE — Telephone Encounter (Signed)
Pt was in the office seeing Jari Favre, PAC. She states Dr. Mayford Knife wanted to order an Itamar study.   Patient agreement reviewed and signed on 11/29/2022.  WatchPAT issued to patient on 11/29/2022 by Danielle Rankin, CMA. Patient aware to not open the WatchPAT box until contacted with the activation PIN. Patient profile initialized in CloudPAT on 11/29/2022 by Danielle Rankin, CMA. Device serial number: [id number]  Please list Reason for Call as Advice Only and type "WatchPAT issued to patient" in the comment box.

## 2022-11-29 NOTE — Patient Instructions (Addendum)
Medication Instructions:  Your physician has recommended you make the following change in your medication:  INCREASE the Metoprolol to 25 mg taking 1 1/2 tablet at bedtime  *If you need a refill on your cardiac medications before your next appointment, please call your pharmacy*   Lab Work: None ordered  If you have labs (blood work) drawn today and your tests are completely normal, you will receive your results only by: MyChart Message (if you have MyChart) OR A paper copy in the mail If you have any lab test that is abnormal or we need to change your treatment, we will call you to review the results.   Testing/Procedures: Your physician has recommended that you have a sleep study. This test records several body functions during sleep, including: brain activity, eye movement, oxygen and carbon dioxide blood levels, heart rate and rhythm, breathing rate and rhythm, the flow of air through your mouth and nose, snoring, body muscle movements, and chest and belly movement.    Follow-Up: At [facility], you and your health needs are our priority.  As part of our continuing mission to provide you with exceptional heart care, we have created designated Provider Care Teams.  These Care Teams include your primary Cardiologist (physician) and Advanced Practice Providers (APPs -  Physician Assistants and Nurse Practitioners) who all work together to provide you with the care you need, when you need it.  We recommend signing up for the patient portal called "MyChart".  Sign up information is provided on this After Visit Summary.  MyChart is used to connect with patients for Virtual Visits (Telemedicine).  Patients are able to view lab/test results, encounter notes, upcoming appointments, etc.  Non-urgent messages can be sent to your provider as well.   To learn more about what you can do with MyChart, go to [email or web address].    Your next appointment:   6 month(s)  Provider:    Armanda Magic, MD    Other Instructions

## 2022-11-30 ENCOUNTER — Telehealth: Payer: Self-pay

## 2022-11-30 DIAGNOSIS — G4733 Obstructive sleep apnea (adult) (pediatric): Secondary | ICD-10-CM

## 2022-11-30 NOTE — Telephone Encounter (Signed)
Sent Mychart message to patient letting him know that Dr. Mayford Knife would like to order itamar HST off CPAP to see if he still has OSA. Order placed.

## 2022-12-03 NOTE — Telephone Encounter (Signed)
Pt was set up with Itamar sleep study 11/29/22.

## 2022-12-09 ENCOUNTER — Ambulatory Visit: Payer: Medicare Other | Attending: Cardiology

## 2022-12-09 DIAGNOSIS — I10 Essential (primary) hypertension: Secondary | ICD-10-CM

## 2022-12-09 DIAGNOSIS — I471 Supraventricular tachycardia, unspecified: Secondary | ICD-10-CM

## 2022-12-09 DIAGNOSIS — G4733 Obstructive sleep apnea (adult) (pediatric): Secondary | ICD-10-CM

## 2022-12-25 ENCOUNTER — Encounter (INDEPENDENT_AMBULATORY_CARE_PROVIDER_SITE_OTHER): Payer: Medicare Other | Admitting: Cardiology

## 2022-12-25 DIAGNOSIS — R0683 Snoring: Secondary | ICD-10-CM

## 2022-12-25 DIAGNOSIS — G4733 Obstructive sleep apnea (adult) (pediatric): Secondary | ICD-10-CM

## 2022-12-25 NOTE — Telephone Encounter (Signed)
**Note De-Identified Henry Fleming Obfuscation** Ordering provider: Dr Mayford Knife Associated diagnoses: OSA-G47.33 and HTN-I10 WatchPAT PA obtained on 12/25/2022 by Henry Fleming, Henry Formosa, LPN. Authorization: Per the Medicare part A and B website:  Prior Authorization requests for Sleep Study codes (16109, [id number], [id number], [id number], [id number], [email or web address]) will not require a Prior Authorization. Patient notified of PIN (1234) on 12/25/2022 Henry Fleming Notification Method: phone.  Phone note routed to covering staff for follow-up.

## 2023-01-03 ENCOUNTER — Encounter: Payer: Self-pay | Admitting: Physician Assistant

## 2023-01-08 NOTE — Procedures (Signed)
   SLEEP STUDY REPORT Patient Information Study Date: 12/25/2022 Patient Name: Henry Fleming Patient ID: [id number] Birth Date: [birth year] Age: 67 Gender: Male BMI: 27.1 (W=216 lb, H=6' 3'') Stopbang: 5 Referring Physician: Armanda Magic, MD  TEST DESCRIPTION: Home sleep apnea testing was completed using the WatchPat, a Type 1 device, utilizing  peripheral arterial tonometry (PAT), chest movement, actigraphy, pulse oximetry, pulse rate, body position and snore.  AHI was calculated with apnea and hypopnea using valid sleep time as the denominator. RDI includes apneas,  hypopneas, and RERAs. The data acquired and the scoring of sleep and all associated events were performed in  accordance with the recommended standards and specifications as outlined in the AASM Manual for the Scoring of  Sleep and Associated Events 2.2.0 (2015).   FINDINGS: 1. No evidence of Obstructive Sleep Apnea with AHI 1.5/hr.  2. No Central Sleep Apnea. 3. Oxygen desaturations as low as 89%. 4. Moderate snoring was present. O2 sats were < 88% for 0 minutes. 5. Total sleep time was 7 hrs and 15 min. 6. 13.7% of total sleep time was spent in REM sleep.  7. Shortened sleep onset latency at 5 min.  8. Prolonged REM sleep onset latency at 192 min.  9. Total awakenings were 7.   DIAGNOSIS:  Normal study with no significant sleep disordered breathing.  RECOMMENDATIONS: 1. Normal study with no significant sleep disordered breathing.  2. Healthy sleep recommendations include: adequate nightly sleep (normal 7-9 hrs/night), avoidance of caffeine after  noon and alcohol near bedtime, and maintaining a sleep environment that is cool, dark and quiet.  3. Weight loss for overweight patients is recommended.   4. Snoring recommendations include: weight loss where appropriate, side sleeping, and avoidance of alcohol before  bed.  5. Operation of motor vehicle or dangerous equipment must be avoided when feeling  drowsy, excessively sleepy, or  mentally fatigued.   6. An ENT consultation which may be useful for specific causes of and possible treatment of bothersome snoring .   7. Weight loss may be of benefit in reducing the severity of snoring.   Signature: Armanda Magic, MD; [facility]; Diplomat, American Board of Sleep  Medicine Electronically Signed: 01/08/2023 5:56:55 PM

## 2023-01-14 ENCOUNTER — Telehealth: Payer: Self-pay | Admitting: *Deleted

## 2023-01-14 NOTE — Telephone Encounter (Signed)
The patient has been notified of the result and verbalized understanding.  All questions (if any) were answered. Latrelle Dodrill, CMA 01/14/2023 6:58 PM     Pt is aware and agreeable to normal results.

## 2023-01-14 NOTE — Telephone Encounter (Signed)
-----   Message from Armanda Magic sent at 01/08/2023  5:59 PM EST ----- Please let patient know that sleep study showed no significant sleep apnea.  Patient can stop CPAP

## 2023-01-15 NOTE — Telephone Encounter (Signed)
You can come in and sign a release and pick it up. Just let me know ahead of time when you are coming. I will be out of the office after today returning on Monday 01/21/23.

## 2023-01-15 NOTE — Telephone Encounter (Signed)
I will leave it at the front desk so all you have to do is sign the release and go.

## 2023-01-17 ENCOUNTER — Other Ambulatory Visit (HOSPITAL_BASED_OUTPATIENT_CLINIC_OR_DEPARTMENT_OTHER): Payer: Self-pay

## 2023-01-17 MED ORDER — FLUAD 0.5 ML IM SUSY
PREFILLED_SYRINGE | INTRAMUSCULAR | 0 refills | Status: AC
  Filled 2023-01-17: qty 0.5, 1d supply, fill #0

## 2023-04-02 IMAGING — CT CT CARDIAC CORONARY ARTERY CALCIUM SCORE
3 series · 14 of 20 positions shown, 16 images · non-contrast
Comparison: None.
COMPARISON: None.

Addendum:
EXAM:
OVER-READ INTERPRETATION  CT CHEST

The following report is an over-read performed by radiologist Dr.
Iljaz Sabaheta Trajkovic [REDACTED] on 06/06/2020. This
over-read does not include interpretation of cardiac or coronary
anatomy or pathology. The coronary calcium score interpretation by
the cardiologist is attached.
CLINICAL DATA: Cardiovascular Disease Risk stratification
Coronary Calcium Score
TECHNIQUE: A gated, non-contrast computed tomography scan of the heart was
performed using 3mm slice thickness. Axial images were analyzed on a
dedicated workstation. Calcium scoring of the coronary arteries was
performed using the Agatston method.

[Series 2: casc 3.0 best diast 63 % (id) · axial · 0.39mm/px · z∈[+1337,+1415]mm · 4 of 44 slices shown]
[im 9/44  vessel]
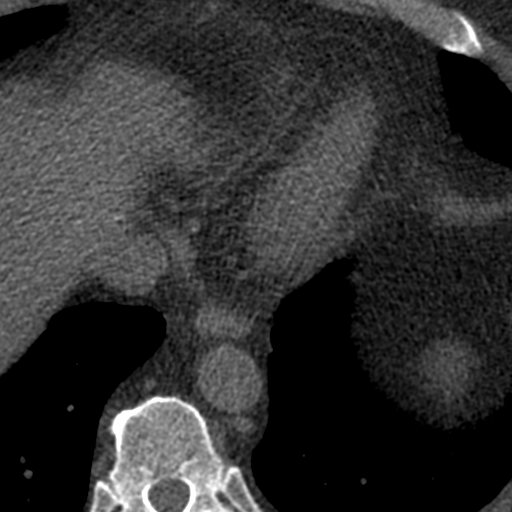
[im 18/44  vessel]
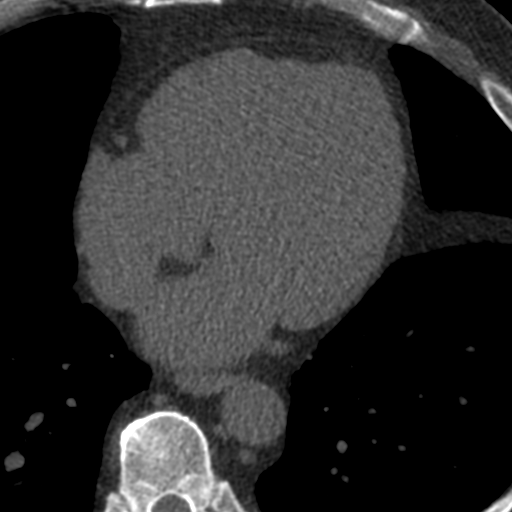
[im 26/44  vessel]
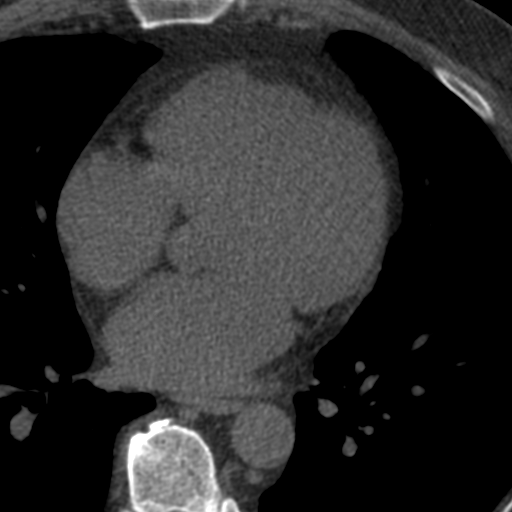
[im 35/44  vessel]
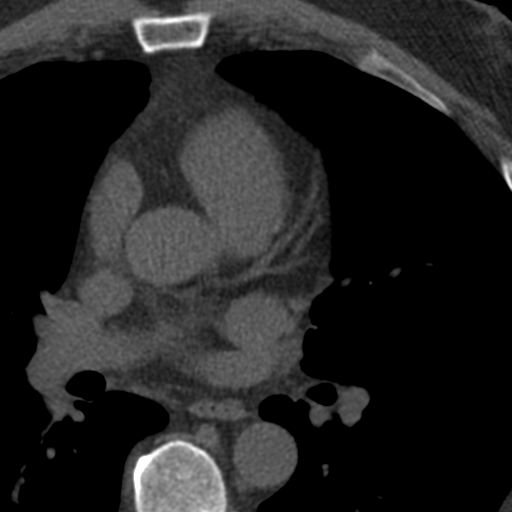

[Series 3: soft full fov 64 % · axial · 0.72mm/px · z∈[+1334,+1418]mm · 5 of 44 slices shown, 7 images]
[im 8/44  vessel]
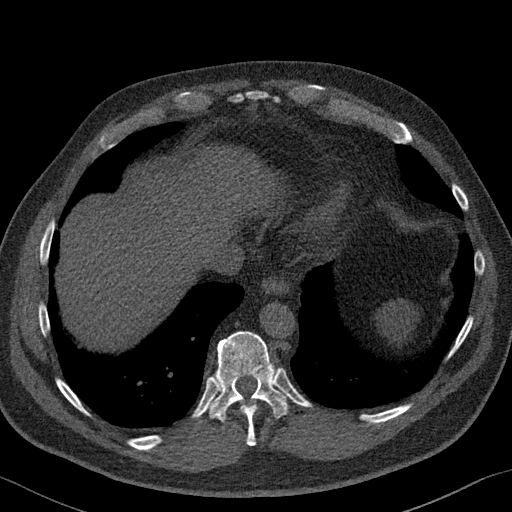
[im 8/44  lung]
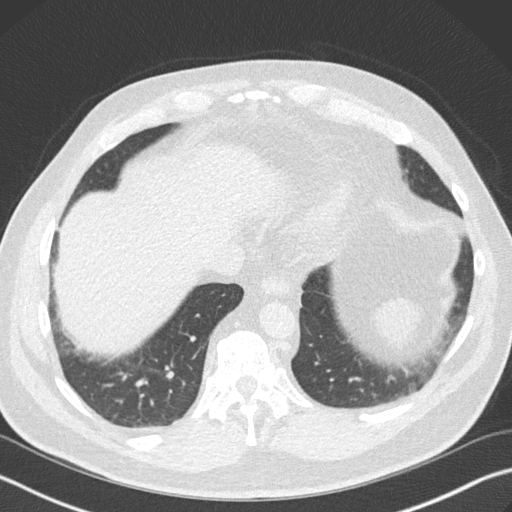
[im 15/44  vessel]
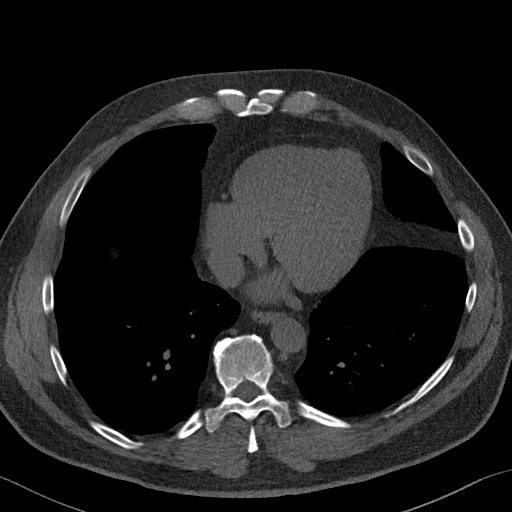
[im 22/44  vessel]
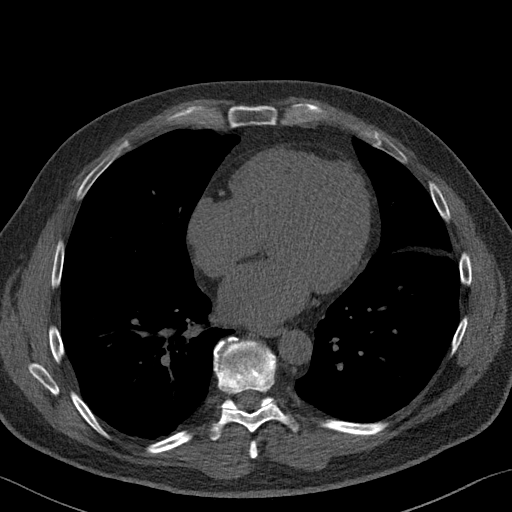
[im 29/44  vessel]
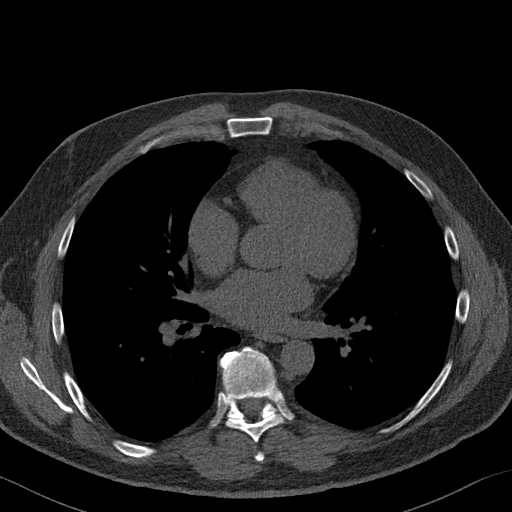
[im 36/44  vessel]
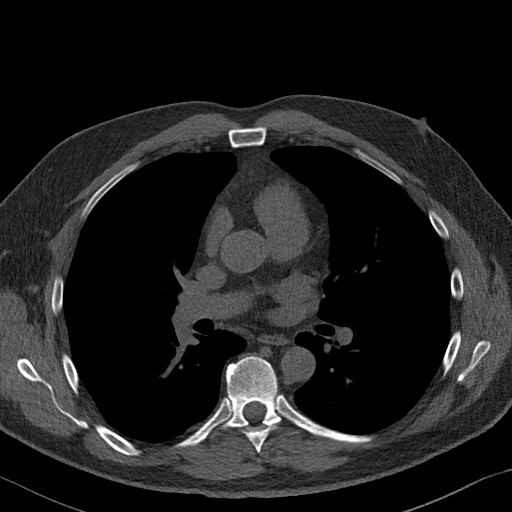
[im 36/44  lung]
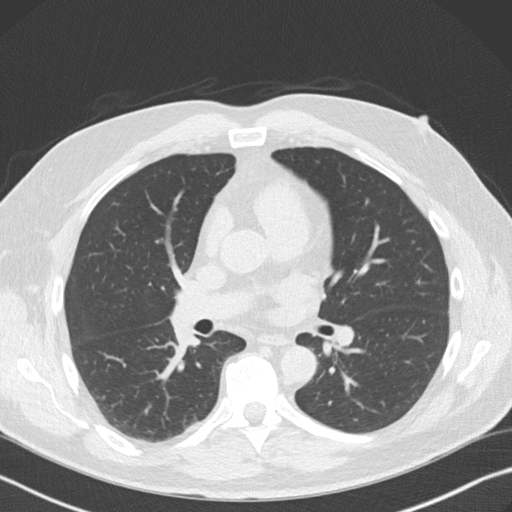

[Series 4: lungs 64 % · axial · 0.72mm/px · z∈[+1334,+1418]mm · 5 of 44 slices shown]
[im 8/44  vessel]
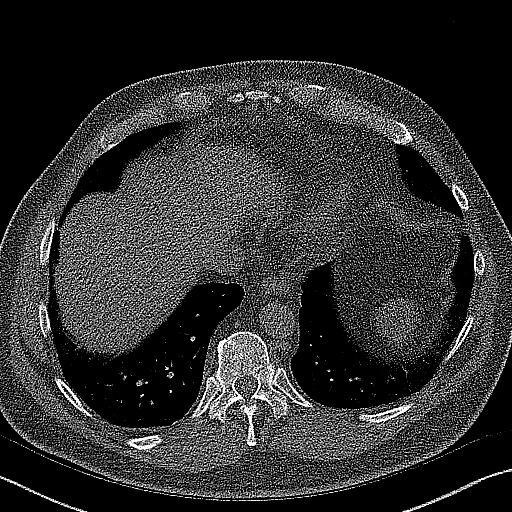
[im 15/44  vessel]
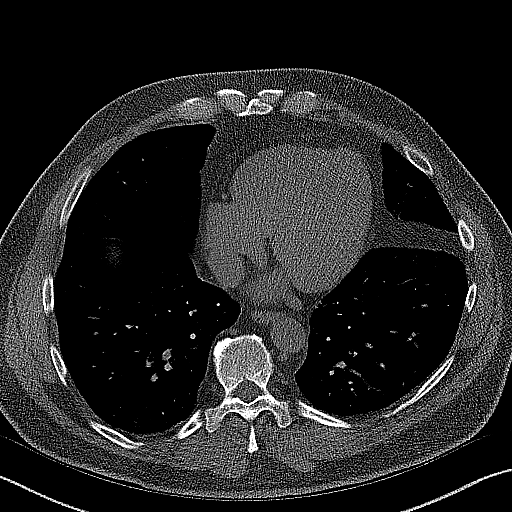
[im 22/44  vessel]
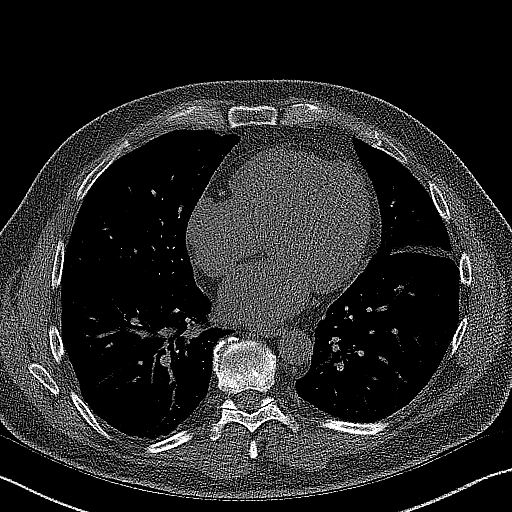
[im 29/44  vessel]
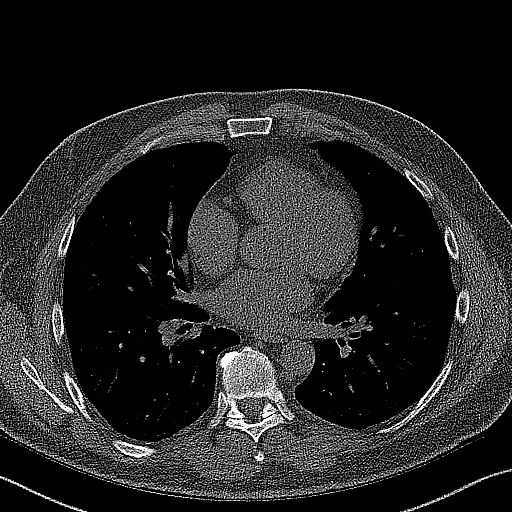
[im 36/44  vessel]
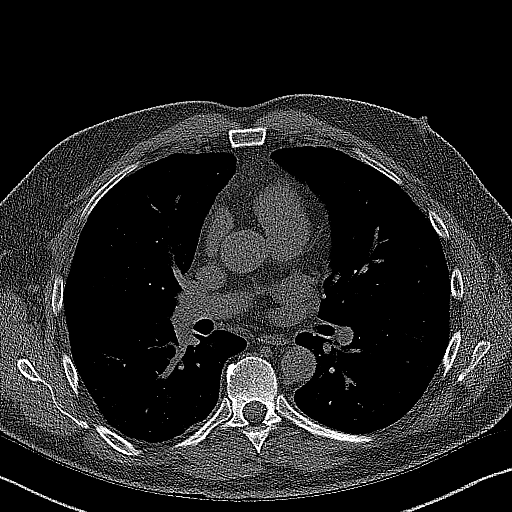

[14 of 20 positions shown; findings below may reference images not displayed]

FINDINGS: Vascular: No significant noncardiac vascular findings.

Mediastinum/Nodes: Visualized mediastinum and hilar regions
demonstrate no lymphadenopathy or masses.

Lungs/Pleura: Visualized lungs show no evidence of pulmonary edema,
consolidation, pneumothorax, nodule or pleural fluid.

Upper Abdomen: No acute abnormality.

Musculoskeletal: No chest wall mass or suspicious bone lesions
identified.
IMPRESSION: No significant incidental findings.
FINDINGS: Coronary arteries: Normal origins.

Coronary Calcium Score:

Left main: 0

Left anterior descending artery: 0

Left circumflex artery: 0

Right coronary artery: 0

Total: 0

Percentile: 0

Pericardium: Normal.

Ascending Aorta: Normal caliber.

Non-cardiac: See separate report from [REDACTED].
IMPRESSION: Coronary calcium score of 0. This was 0 percentile for age-, race-,
and sex-matched controls.



If CAC=0, it is reasonable to withhold statin therapy and reassess
in 5 to 10 years, as long as higher risk conditions are absent
(diabetes mellitus, family history of premature CHD in first degree
relatives (males <55 years; females <65 years), cigarette smoking,
or LDL >=190 mg/dL).

If CAC is 1 to 99, it is reasonable to initiate statin therapy for
patients >=55 years of age.

If CAC is >=100 or >=75th percentile, it is reasonable to initiate
statin therapy at any age.

Cardiology referral should be considered for patients with CAC
scores >=400 or >=75th percentile.

*0239 AHA/ACC/AACVPR/AAPA/ABC/AVIDEL/BANSAL CYPRUS/QUIRIJN/Kaderine/ABRAHAM KHAN/ANTOINETTE/CHIANCA
Guideline on the Management of Blood Cholesterol: A Report of the
American College of Cardiology/American Heart Association Task Force
on Clinical Practice Guidelines. J Am Coll Cardiol.
9826;73(24):8408-8951.

*** End of Addendum ***
EXAM:
OVER-READ INTERPRETATION  CT CHEST

The following report is an over-read performed by radiologist Dr.
Iljaz Sabaheta Trajkovic [REDACTED] on 06/06/2020. This
over-read does not include interpretation of cardiac or coronary
anatomy or pathology. The coronary calcium score interpretation by
the cardiologist is attached.
FINDINGS: Vascular: No significant noncardiac vascular findings.

Mediastinum/Nodes: Visualized mediastinum and hilar regions
demonstrate no lymphadenopathy or masses.

Lungs/Pleura: Visualized lungs show no evidence of pulmonary edema,
consolidation, pneumothorax, nodule or pleural fluid.

Upper Abdomen: No acute abnormality.

Musculoskeletal: No chest wall mass or suspicious bone lesions
identified.
IMPRESSION: No significant incidental findings.

## 2023-07-31 ENCOUNTER — Encounter: Payer: Self-pay | Admitting: Cardiology

## 2023-08-08 ENCOUNTER — Telehealth: Payer: Self-pay

## 2023-08-08 DIAGNOSIS — G4719 Other hypersomnia: Secondary | ICD-10-CM

## 2023-08-08 DIAGNOSIS — G4733 Obstructive sleep apnea (adult) (pediatric): Secondary | ICD-10-CM

## 2023-08-08 NOTE — Telephone Encounter (Signed)
 MC to patient to advise to do home sleep study OFF CPAP. Itamar order placed.     SNORING-YES DAYTIME SLEEPINESS-YES AGE >50 GENDER MALE STOP BANG SCORE 4

## 2023-10-01 ENCOUNTER — Encounter: Payer: Self-pay | Admitting: Cardiology

## 2023-10-11 ENCOUNTER — Telehealth: Payer: Self-pay

## 2023-10-11 NOTE — Telephone Encounter (Addendum)
**Note De-Identified Britt Theard Obfuscation** Ordering provider: Dr Shlomo Associated diagnoses: OSA-G47.33 and HTN-I10 WatchPAT PA obtained on 10/11/2023 by Koral Thaden, Avelina HERO, LPN. Authorization: Per the Medicare Part B website, a PA is not required for CPT Code: 04199 (itamar-HST).  Patient notified of PIN (1234) on 10/11/2023 Kentavious Michele phone. I called the pt and he states that he plans to come by the office next Thursday 10/17/2023 to pick up a device with instructions.  Phone note routed to covering staff for follow-up.  Instructions for covering staff:  Please contact patient in 2 weeks if WatchPAT study results are not available yet. Remind patient to complete test.  If patient declines to proceed with test, please confirm that box is unopened and remind patient to return it to the office within 30 days. Route phone note to [facility] pool for tracking.  If box has been opened, please route phone note to [facility] pool to have device de-initialized and processed for billing.

## 2023-10-14 NOTE — Telephone Encounter (Signed)
 Per 10/11/23 telephone note, patient plans to pick up Itamar device at the office on 10/17/23.

## 2023-10-17 NOTE — Telephone Encounter (Signed)
**Note De-Identified Makyia Erxleben Obfuscation** Patient agreement reviewed and signed on 10/17/2023.  WatchPAT issued to patient on 10/17/2023 by Grenda Lora, Avelina HERO, LPN. Patient aware to not open the WatchPAT box until contacted with the activation PIN. Patient profile initialized in Cloud[employer] on 10/17/2023 by Macario Kyree Adriano, LPN. Device serial number: [id number]  Please list Reason for Call as Advice Only and type WatchPAT issued to patient in the comment box.

## 2023-10-19 ENCOUNTER — Encounter (INDEPENDENT_AMBULATORY_CARE_PROVIDER_SITE_OTHER): Payer: Self-pay | Admitting: Cardiology

## 2023-10-19 DIAGNOSIS — G4733 Obstructive sleep apnea (adult) (pediatric): Secondary | ICD-10-CM

## 2023-10-31 ENCOUNTER — Ambulatory Visit: Attending: Cardiology

## 2023-10-31 DIAGNOSIS — G4733 Obstructive sleep apnea (adult) (pediatric): Secondary | ICD-10-CM

## 2023-10-31 DIAGNOSIS — G4719 Other hypersomnia: Secondary | ICD-10-CM

## 2023-10-31 NOTE — Procedures (Signed)
   SLEEP STUDY REPORT Patient Information Study Date: 10/19/2023 Patient Name: Henry Fleming Patient ID: [id number] Birth Date: [birth year] Age: 68 Gender: Male BMI: 27.1 (W=216 lb, H=6' 3'') Stopbang: 5 Referring Physician: Wilbert Bihari, MD  TEST DESCRIPTION: Home sleep apnea testing was completed using the WatchPat, a Type 1 device, utilizing  peripheral arterial tonometry (PAT), chest movement, actigraphy, pulse oximetry, pulse rate, body position and snore.  AHI was calculated with apnea and hypopnea using valid sleep time as the denominator. RDI includes apneas,  hypopneas, and RERAs. The data acquired and the scoring of sleep and all associated events were performed in  accordance with the recommended standards and specifications as outlined in the AASM Manual for the Scoring of  Sleep and Associated Events 2.2.0 (2015).   FINDINGS:   1. Mild Obstructive Sleep Apnea with AHI 7.7/hr overall but moderate during REM sleep with REM AHI 17.4/hr.   2. No Central Sleep Apnea with pAHIc 0/hr.   3. Oxygen desaturations as low as 85%.   4. Moderate to severe snoring was present. O2 sats were < 88% for 1.8 min.   5. Total sleep time was 7 hrs and 53 min.   6. 15% of total sleep time was spent in REM sleep.   7. Normal sleep onset latency at 15 min.   8. Prolonged REM sleep onset latency at 140 min.   9. Total awakenings were 8.  10. Arrhythmia detection: None  DIAGNOSIS: Mild Obstructive Sleep Apnea (G47.33) overall but moderate during REM sleep  RECOMMENDATIONS: 1. Clinical correlation of these findings is necessary. The decision to treat obstructive sleep apnea (OSA) is usually  based on the presence of apnea symptoms or the presence of associated medical conditions such as Hypertension,  Congestive Heart Failure, Atrial Fibrillation or Obesity. The most common symptoms of OSA are snoring, gasping for  breath while sleeping, daytime sleepiness and fatigue.  2. Initiating apnea  therapy is recommended given the presence of symptoms and/or associated conditions.  Recommend proceeding with one of the following:  a. Auto-CPAP therapy with a pressure range of 5-20cm H2O.  b. An oral appliance (OA) that can be obtained from certain dentists with expertise in sleep medicine. These are  primarily of use in non-obese patients with mild and moderate disease.  c. An ENT consultation which may be useful to look for specific causes of obstruction and possible treatment  options.  d. If patient is intolerant to PAP therapy, consider referral to ENT for evaluation for hypoglossal nerve stimulator.  3. Close follow-up is necessary to ensure success with CPAP or oral appliance therapy for maximum benefit . 4. A follow-up oximetry study on CPAP is recommended to assess the adequacy of therapy and determine the need  for supplemental oxygen or the potential need for Bi-level therapy. An arterial blood gas to determine the adequacy of  baseline ventilation and oxygenation should also be considered. 5. Healthy sleep recommendations include: adequate nightly sleep (normal 7-9 hrs/night), avoidance of caffeine after  noon and alcohol near bedtime, and maintaining a sleep environment that is cool, dark and quiet. 6. Weight loss for overweight patients is recommended. Even modest amounts of weight loss can significantly  improve the severity of sleep apnea. 7. Snoring recommendations include: weight loss where appropriate, side sleeping, and avoidance of alcohol before  bed. 8. Operation of motor vehicle should be avoided when sleepy.  Signature: Wilbert Bihari, MD; [facility]; Diplomat, American Board of Sleep  Medicine Electronically Signed: 10/31/2023 1:48:32 PM

## 2023-11-01 ENCOUNTER — Telehealth: Payer: Self-pay | Admitting: *Deleted

## 2023-11-01 DIAGNOSIS — G4733 Obstructive sleep apnea (adult) (pediatric): Secondary | ICD-10-CM

## 2023-11-01 DIAGNOSIS — I10 Essential (primary) hypertension: Secondary | ICD-10-CM

## 2023-11-01 DIAGNOSIS — G4719 Other hypersomnia: Secondary | ICD-10-CM

## 2023-11-01 NOTE — Telephone Encounter (Signed)
-----   Message from Wilbert Bihari sent at 10/31/2023  1:51 PM EDT ----- Please let patient know that they have sleep apnea and recommend treating with CPAP.  Please order an auto CPAP from 4-15cm H2O with heated humidity and mask of choice.  Order overnight pulse ox on CPAP.  Followup with me in 6 weeks.

## 2023-11-01 NOTE — Telephone Encounter (Signed)
The patient has been notified of the result. Left detailed message on voicemail and informed patient to call back..Syniyah Bourne Green, CMA
